# Patient Record
Sex: Female | Born: 1955 | Race: White | Hispanic: No | Marital: Married | Smoking: Never smoker
Health system: Southern US, Community
[De-identification: ages and names within clinical notes are randomized; demographics above are authoritative.]

## PROBLEM LIST (undated history)

## (undated) DIAGNOSIS — F32A Depression, unspecified: Secondary | ICD-10-CM

## (undated) DIAGNOSIS — F419 Anxiety disorder, unspecified: Secondary | ICD-10-CM

## (undated) DIAGNOSIS — T7840XA Allergy, unspecified, initial encounter: Secondary | ICD-10-CM

## (undated) DIAGNOSIS — M797 Fibromyalgia: Secondary | ICD-10-CM

## (undated) DIAGNOSIS — A879 Viral meningitis, unspecified: Secondary | ICD-10-CM

## (undated) DIAGNOSIS — G4733 Obstructive sleep apnea (adult) (pediatric): Secondary | ICD-10-CM

## (undated) DIAGNOSIS — J45909 Unspecified asthma, uncomplicated: Secondary | ICD-10-CM

## (undated) DIAGNOSIS — C50919 Malignant neoplasm of unspecified site of unspecified female breast: Secondary | ICD-10-CM

## (undated) DIAGNOSIS — I071 Rheumatic tricuspid insufficiency: Secondary | ICD-10-CM

## (undated) DIAGNOSIS — I10 Essential (primary) hypertension: Secondary | ICD-10-CM

## (undated) DIAGNOSIS — F329 Major depressive disorder, single episode, unspecified: Secondary | ICD-10-CM

## (undated) HISTORY — DX: Depression, unspecified: F32.A

## (undated) HISTORY — DX: Malignant neoplasm of unspecified site of unspecified female breast: C50.919

## (undated) HISTORY — DX: Major depressive disorder, single episode, unspecified: F32.9

## (undated) HISTORY — DX: Allergy, unspecified, initial encounter: T78.40XA

## (undated) HISTORY — DX: Obstructive sleep apnea (adult) (pediatric): G47.33

## (undated) HISTORY — PX: BREAST SURGERY: SHX581

## (undated) HISTORY — DX: Unspecified asthma, uncomplicated: J45.909

## (undated) HISTORY — DX: Viral meningitis, unspecified: A87.9

## (undated) HISTORY — DX: Anxiety disorder, unspecified: F41.9

## (undated) HISTORY — PX: MASTECTOMY: SHX3

## (undated) HISTORY — DX: Rheumatic tricuspid insufficiency: I07.1

## (undated) HISTORY — DX: Fibromyalgia: M79.7

## (undated) HISTORY — PX: VESICOVAGINAL FISTULA CLOSURE W/ TAH: SUR271

## (undated) HISTORY — DX: Essential (primary) hypertension: I10

---

## 1997-05-27 DIAGNOSIS — A879 Viral meningitis, unspecified: Secondary | ICD-10-CM

## 1997-05-27 HISTORY — DX: Viral meningitis, unspecified: A87.9

## 1998-03-28 ENCOUNTER — Emergency Department (HOSPITAL_COMMUNITY): Admission: EM | Admit: 1998-03-28 | Discharge: 1998-03-28 | Payer: Self-pay | Admitting: Emergency Medicine

## 1998-03-31 ENCOUNTER — Emergency Department (HOSPITAL_COMMUNITY): Admission: EM | Admit: 1998-03-31 | Discharge: 1998-03-31 | Payer: Self-pay | Admitting: *Deleted

## 1998-04-04 ENCOUNTER — Encounter (HOSPITAL_COMMUNITY): Admission: RE | Admit: 1998-04-04 | Discharge: 1998-04-25 | Payer: Self-pay | Admitting: *Deleted

## 1999-02-21 ENCOUNTER — Encounter: Payer: Self-pay | Admitting: Internal Medicine

## 1999-02-21 ENCOUNTER — Ambulatory Visit (HOSPITAL_COMMUNITY): Admission: RE | Admit: 1999-02-21 | Discharge: 1999-02-21 | Payer: Self-pay | Admitting: Internal Medicine

## 1999-04-19 ENCOUNTER — Emergency Department (HOSPITAL_COMMUNITY): Admission: EM | Admit: 1999-04-19 | Discharge: 1999-04-19 | Payer: Self-pay | Admitting: Emergency Medicine

## 1999-11-08 ENCOUNTER — Other Ambulatory Visit: Admission: RE | Admit: 1999-11-08 | Discharge: 1999-11-08 | Payer: Self-pay | Admitting: *Deleted

## 2000-06-27 DIAGNOSIS — I071 Rheumatic tricuspid insufficiency: Secondary | ICD-10-CM

## 2000-06-27 HISTORY — DX: Rheumatic tricuspid insufficiency: I07.1

## 2001-04-01 ENCOUNTER — Encounter: Payer: Self-pay | Admitting: General Surgery

## 2001-04-01 ENCOUNTER — Encounter: Admission: RE | Admit: 2001-04-01 | Discharge: 2001-04-01 | Payer: Self-pay | Admitting: General Surgery

## 2002-06-28 ENCOUNTER — Other Ambulatory Visit: Admission: RE | Admit: 2002-06-28 | Discharge: 2002-06-28 | Payer: Self-pay | Admitting: Obstetrics and Gynecology

## 2002-07-01 ENCOUNTER — Encounter: Payer: Self-pay | Admitting: Obstetrics and Gynecology

## 2002-07-01 ENCOUNTER — Encounter: Admission: RE | Admit: 2002-07-01 | Discharge: 2002-07-01 | Payer: Self-pay | Admitting: Obstetrics and Gynecology

## 2003-11-15 ENCOUNTER — Encounter: Admission: RE | Admit: 2003-11-15 | Discharge: 2003-11-15 | Payer: Self-pay | Admitting: Obstetrics and Gynecology

## 2004-01-28 DIAGNOSIS — I10 Essential (primary) hypertension: Secondary | ICD-10-CM

## 2004-01-28 HISTORY — DX: Essential (primary) hypertension: I10

## 2004-03-11 ENCOUNTER — Other Ambulatory Visit: Admission: RE | Admit: 2004-03-11 | Discharge: 2004-03-11 | Payer: Self-pay | Admitting: Obstetrics and Gynecology

## 2004-11-29 ENCOUNTER — Encounter: Admission: RE | Admit: 2004-11-29 | Discharge: 2004-11-29 | Payer: Self-pay | Admitting: Obstetrics and Gynecology

## 2005-02-27 DIAGNOSIS — M797 Fibromyalgia: Secondary | ICD-10-CM

## 2005-02-27 HISTORY — DX: Fibromyalgia: M79.7

## 2005-09-09 ENCOUNTER — Other Ambulatory Visit: Admission: RE | Admit: 2005-09-09 | Discharge: 2005-09-09 | Payer: Self-pay | Admitting: Obstetrics and Gynecology

## 2006-09-14 ENCOUNTER — Encounter: Admission: RE | Admit: 2006-09-14 | Discharge: 2006-09-14 | Payer: Self-pay | Admitting: Obstetrics and Gynecology

## 2006-09-16 ENCOUNTER — Encounter: Admission: RE | Admit: 2006-09-16 | Discharge: 2006-09-16 | Payer: Self-pay | Admitting: Obstetrics and Gynecology

## 2006-12-10 ENCOUNTER — Encounter: Admission: RE | Admit: 2006-12-10 | Discharge: 2006-12-10 | Payer: Self-pay | Admitting: Family Medicine

## 2008-11-01 ENCOUNTER — Encounter: Admission: RE | Admit: 2008-11-01 | Discharge: 2008-11-01 | Payer: Self-pay | Admitting: Obstetrics and Gynecology

## 2008-11-03 ENCOUNTER — Encounter (INDEPENDENT_AMBULATORY_CARE_PROVIDER_SITE_OTHER): Payer: Self-pay | Admitting: Obstetrics and Gynecology

## 2008-11-03 ENCOUNTER — Encounter (INDEPENDENT_AMBULATORY_CARE_PROVIDER_SITE_OTHER): Payer: Self-pay | Admitting: Diagnostic Radiology

## 2008-11-03 ENCOUNTER — Encounter: Admission: RE | Admit: 2008-11-03 | Discharge: 2008-11-03 | Payer: Self-pay | Admitting: Obstetrics and Gynecology

## 2008-11-09 ENCOUNTER — Encounter: Admission: RE | Admit: 2008-11-09 | Discharge: 2008-11-09 | Payer: Self-pay | Admitting: Obstetrics and Gynecology

## 2008-11-14 ENCOUNTER — Ambulatory Visit: Payer: Self-pay | Admitting: Genetic Counselor

## 2008-12-25 ENCOUNTER — Inpatient Hospital Stay (HOSPITAL_COMMUNITY): Admission: AD | Admit: 2008-12-25 | Discharge: 2008-12-27 | Payer: Self-pay | Admitting: Surgery

## 2008-12-25 ENCOUNTER — Encounter (INDEPENDENT_AMBULATORY_CARE_PROVIDER_SITE_OTHER): Payer: Self-pay | Admitting: Surgery

## 2009-01-15 ENCOUNTER — Ambulatory Visit: Payer: Self-pay | Admitting: Oncology

## 2009-01-15 LAB — CBC WITH DIFFERENTIAL/PLATELET
Eosinophils Absolute: 0.3 10*3/uL (ref 0.0–0.5)
HCT: 36.9 % (ref 34.8–46.6)
HGB: 12.4 g/dL (ref 11.6–15.9)
LYMPH%: 28.8 % (ref 14.0–49.7)
MONO#: 0.4 10*3/uL (ref 0.1–0.9)
NEUT#: 4.1 10*3/uL (ref 1.5–6.5)
NEUT%: 59.8 % (ref 38.4–76.8)
Platelets: 217 10*3/uL (ref 145–400)
WBC: 6.9 10*3/uL (ref 3.9–10.3)

## 2009-01-15 LAB — COMPREHENSIVE METABOLIC PANEL
CO2: 24 mEq/L (ref 19–32)
Calcium: 9.2 mg/dL (ref 8.4–10.5)
Creatinine, Ser: 0.86 mg/dL (ref 0.40–1.20)
Glucose, Bld: 81 mg/dL (ref 70–99)
Total Bilirubin: 0.3 mg/dL (ref 0.3–1.2)
Total Protein: 7.1 g/dL (ref 6.0–8.3)

## 2009-03-27 HISTORY — PX: ABDOMINAL HYSTERECTOMY: SHX81

## 2009-04-24 ENCOUNTER — Ambulatory Visit (HOSPITAL_COMMUNITY): Admission: RE | Admit: 2009-04-24 | Discharge: 2009-04-26 | Payer: Self-pay | Admitting: Obstetrics and Gynecology

## 2009-04-24 ENCOUNTER — Encounter (INDEPENDENT_AMBULATORY_CARE_PROVIDER_SITE_OTHER): Payer: Self-pay | Admitting: Obstetrics and Gynecology

## 2009-05-28 ENCOUNTER — Other Ambulatory Visit: Admission: RE | Admit: 2009-05-28 | Discharge: 2009-05-28 | Payer: Self-pay | Admitting: General Surgery

## 2009-06-11 ENCOUNTER — Ambulatory Visit (HOSPITAL_BASED_OUTPATIENT_CLINIC_OR_DEPARTMENT_OTHER): Admission: RE | Admit: 2009-06-11 | Discharge: 2009-06-11 | Payer: Self-pay | Admitting: Surgery

## 2010-01-11 ENCOUNTER — Ambulatory Visit: Payer: Self-pay | Admitting: Oncology

## 2010-02-11 ENCOUNTER — Ambulatory Visit: Payer: Self-pay | Admitting: Oncology

## 2010-04-22 LAB — BASIC METABOLIC PANEL
Calcium: 9.3 mg/dL (ref 8.4–10.5)
Chloride: 102 mEq/L (ref 96–112)
Creatinine, Ser: 0.88 mg/dL (ref 0.4–1.2)
GFR calc Af Amer: >60 mL/min (ref >60)
Potassium: 3.6 mEq/L (ref 3.5–5.1)
Sodium: 139 mEq/L (ref 135–145)

## 2010-04-22 LAB — URINALYSIS, ROUTINE W REFLEX MICROSCOPIC
Bilirubin Urine: NEGATIVE
Glucose, UA: NEGATIVE mg/dL
Hgb urine dipstick: NEGATIVE

## 2010-04-22 LAB — CBC
Hemoglobin: 10.5 g/dL — ABNORMAL LOW (ref 12.0–15.0)
MCV: 87.8 fL (ref 78.0–100.0)
Platelets: 211 10*3/uL (ref 150–400)
RBC: 3.56 MIL/uL — ABNORMAL LOW (ref 3.87–5.11)
RDW: 13.7 % (ref 11.5–15.5)
WBC: 8.1 10*3/uL (ref 4.0–10.5)

## 2010-04-29 ENCOUNTER — Ambulatory Visit (INDEPENDENT_AMBULATORY_CARE_PROVIDER_SITE_OTHER): Payer: BC Managed Care – PPO | Admitting: Family Medicine

## 2010-04-29 DIAGNOSIS — J309 Allergic rhinitis, unspecified: Secondary | ICD-10-CM

## 2010-04-29 DIAGNOSIS — F411 Generalized anxiety disorder: Secondary | ICD-10-CM

## 2010-04-29 DIAGNOSIS — G4700 Insomnia, unspecified: Secondary | ICD-10-CM

## 2010-05-01 LAB — COMPREHENSIVE METABOLIC PANEL
ALT: 25 U/L (ref 0–35)
Alkaline Phosphatase: 70 U/L (ref 39–117)
BUN: 18 mg/dL (ref 6–23)
CO2: 26 mEq/L (ref 19–32)
Calcium: 8.9 mg/dL (ref 8.4–10.5)
GFR calc non Af Amer: >60 mL/min (ref >60)
Total Bilirubin: 0.5 mg/dL (ref 0.3–1.2)

## 2010-05-01 LAB — CBC
MCV: 86.7 fL (ref 78.0–100.0)
RBC: 4.29 MIL/uL (ref 3.87–5.11)
RDW: 13.7 % (ref 11.5–15.5)

## 2010-05-01 LAB — DIFFERENTIAL
Basophils Relative: 1 % (ref 0–1)
Eosinophils Relative: 2 % (ref 0–5)
Lymphocytes Relative: 28 % (ref 12–46)
Monocytes Absolute: 0.4 10*3/uL (ref 0.1–1.0)

## 2010-05-01 LAB — URINALYSIS, ROUTINE W REFLEX MICROSCOPIC
Glucose, UA: NEGATIVE mg/dL
Hgb urine dipstick: NEGATIVE
Nitrite: NEGATIVE
Protein, ur: NEGATIVE mg/dL
Urobilinogen, UA: 0.2 mg/dL (ref 0.0–1.0)

## 2010-06-20 ENCOUNTER — Other Ambulatory Visit: Payer: Self-pay | Admitting: Family Medicine

## 2010-06-20 NOTE — Telephone Encounter (Signed)
Forward to Dr Knapp 

## 2010-07-18 ENCOUNTER — Encounter: Payer: Self-pay | Admitting: Family Medicine

## 2010-07-18 ENCOUNTER — Ambulatory Visit (INDEPENDENT_AMBULATORY_CARE_PROVIDER_SITE_OTHER): Payer: PRIVATE HEALTH INSURANCE | Admitting: Family Medicine

## 2010-07-18 VITALS — BP 132/88 | HR 64 | Ht 64.0 in | Wt 198.0 lb

## 2010-07-18 DIAGNOSIS — R5383 Other fatigue: Secondary | ICD-10-CM

## 2010-07-18 DIAGNOSIS — L03211 Cellulitis of face: Secondary | ICD-10-CM

## 2010-07-18 DIAGNOSIS — L0201 Cutaneous abscess of face: Secondary | ICD-10-CM

## 2010-07-18 DIAGNOSIS — R5381 Other malaise: Secondary | ICD-10-CM

## 2010-07-18 LAB — CBC WITH DIFFERENTIAL/PLATELET
Basophils Absolute: 0 10*3/uL (ref 0.0–0.1)
Basophils Relative: 0 % (ref 0–1)
Hemoglobin: 12.6 g/dL (ref 12.0–15.0)
MCH: 27.9 pg (ref 26.0–34.0)
RDW: 15 % (ref 11.5–15.5)
WBC: 5.3 10*3/uL (ref 4.0–10.5)

## 2010-07-18 LAB — COMPREHENSIVE METABOLIC PANEL
Albumin: 4 g/dL (ref 3.5–5.2)
Alkaline Phosphatase: 71 U/L (ref 39–117)
BUN: 13 mg/dL (ref 6–23)
Chloride: 104 mEq/L (ref 96–112)
Creat: 0.91 mg/dL (ref 0.50–1.10)
Glucose, Bld: 84 mg/dL (ref 70–99)
Total Protein: 7.1 g/dL (ref 6.0–8.3)

## 2010-07-18 MED ORDER — SULFAMETHOXAZOLE-TMP DS 800-160 MG PO TABS: 1.0000 | ORAL_TABLET | Freq: Two times a day (BID) | ORAL | Status: AC

## 2010-07-18 NOTE — Progress Notes (Signed)
Subjective:    Patient ID: Allison Valencia, female    DOB: 1955/11/14, 55 y.o.   MRN: [id number]  HPI Rash to left side of face x 1 month.  Initially thought could be related to her cell phone use on that side of her face.  Washes with antibacterial soap twice daily.  She admits to squeezing the lesions.  Gets some bloody drainage, no pus.  They don't seem to be healing.. Denies any itching, or other lesions on other parts of the body (other than face, neck and 1 lesions on scalp that has healed).  Complaining of fatigue, lack of motivation to do anything.  She just had her last breast surgery 3 weeks ago. Wondering if she still could be having effects of anesthesia.  Moods have been good.  Sleeps well at night--uses xanax to help shut her mind down.  Hasn't been walking or exercising since the surgery.  Past Medical History  Diagnosis Date  . Breast cancer age 77, recurrent 2010  . Allergy   . Anxiety   . Depression   . OSA (obstructive sleep apnea)     sleep study 6/09  . Fibromyalgia 2/07  . Hypertension 2006  . Viral meningitis 5/99  . Tricuspid regurgitation 6/02    mild    Past Surgical History  Procedure Date  . Breast surgery     extended partial R mastectomy with LND age 55; bilateral mastectomy 11/2008, multiple reconstructive surgeries 2011-2012  . Abdominal hysterectomy 03/2009    and BSO; Dr. Stefano Gaul    History   Social History  . Marital Status: Married    Spouse Name: N/A    Number of Children: N/A  . Years of Education: N/A   Occupational History  . Not on file.   Social History Main Topics  . Smoking status: Never Smoker   . Smokeless tobacco: Not on file  . Alcohol Use: Yes     1-2 drinks once or twice a month.  . Drug Use: No  . Sexually Active: Not on file   Other Topics Concern  . Not on file   Social History Narrative  . No narrative on file    Family History  Problem Relation Age of Onset  . Asthma Mother   . Hypertension Mother     . Thyroid disease Mother   . Arthritis Mother   . Heart disease Father     MI  . Diabetes Father   . Bipolar disorder Daughter   . Cancer Maternal Aunt     colon  . Cancer Paternal Aunt     breast cancer  . Cancer Maternal Aunt     colon    Current outpatient prescriptions:ALPRAZolam (XANAX) 1 MG tablet, TAKE ONE-HALF TO ONE TABLET BY MOUTH EVERY 8 HOURS AS NEEDED FOR ANXIETY, Disp: 90 tablet, Rfl: 0;  citalopram (CELEXA) 40 MG tablet, Take 40 mg by mouth daily.  , Disp: , Rfl: ;  sulfamethoxazole-trimethoprim (BACTRIM DS) 800-160 MG per tablet, Take 1 tablet by mouth 2 (two) times daily., Disp: 20 tablet, Rfl: 0  No Known Allergies  Review of Systems No fevers, nausea or vomiting, occasional heartburn, worse recently since eating more pineapple.  Denies allergies or URI symptoms, cough, shortness of breath, or other concerns     Objective:   Physical Exam BP 132/88  Pulse 64  Ht 5\' 4"  (1.626 m)  Wt 198 lb (89.812 kg)  BMI 33.99 kg/m2 Well developed, pleasant, overweight, tanned female in  no distress Skin: L cheek, and anterior to L ear are multiple scabbed, inflamed lesions.  There is some crusting.  Also has some lesions L posterior neck Neck:  No significant lymphadenopathy.  No thyromegaly Heart:  Regular rate and rhythm Lungs:  Clear bilaterally       Assessment & Plan:   1. Abscess of face  sulfamethoxazole-trimethoprim (BACTRIM DS) 800-160 MG per tablet  2. Fatigue  Comprehensive metabolic panel, CBC with Differential, Vitamin D 25 hydroxy, TSH    Lesions appear consistent with probable cysts that are infected, possibly MRSA.  Daughter gets frequent MRSA, so will cover for it with antibiotic choice. Fatigue--if all labs are normal, encouraged her to restart regular exercise routine, which might help with her energy.  May also be related to her OSA

## 2010-07-19 ENCOUNTER — Encounter: Payer: Self-pay | Admitting: Family Medicine

## 2010-08-26 ENCOUNTER — Other Ambulatory Visit: Payer: Self-pay | Admitting: Family Medicine

## 2010-08-26 NOTE — Telephone Encounter (Signed)
Refill request for xanax. Chart is on your shelf. Thanks.

## 2010-08-26 NOTE — Telephone Encounter (Signed)
Okay for refill--please call in

## 2010-09-18 ENCOUNTER — Ambulatory Visit (INDEPENDENT_AMBULATORY_CARE_PROVIDER_SITE_OTHER): Payer: PRIVATE HEALTH INSURANCE | Admitting: Family Medicine

## 2010-09-18 ENCOUNTER — Encounter: Payer: Self-pay | Admitting: Family Medicine

## 2010-09-18 VITALS — BP 140/70 | HR 68 | Temp 98.3°F | Ht 64.0 in | Wt 198.0 lb

## 2010-09-18 DIAGNOSIS — J069 Acute upper respiratory infection, unspecified: Secondary | ICD-10-CM

## 2010-09-18 NOTE — Progress Notes (Signed)
Began 5 days ago with "cold symptoms"--runny nose, sinus drainage, postnasal drip, sore throat, itchy ears. Then coughing started.  Denies fevers. Denies sinus pain, purulent drainage.  Cough is nonproductive.  Had fits of coughing yesterday, to the point it was causing back pain.  Sleeping more than usual, fatigued.  Denies myalgias.  No known sick contacts (other than having cranky grandchild).  Using Dayquil and Nyquil with some improvement.  Has surgery scheduled for 9/4 (reconstructive surgery), and wants to make sure she is well enough for surgery.  Past Medical History  Diagnosis Date  . Breast cancer age 75, recurrent 2010  . Allergy   . Anxiety   . Depression   . OSA (obstructive sleep apnea)     sleep study 6/09  . Fibromyalgia 2/07  . Hypertension 2006  . Viral meningitis 5/99  . Tricuspid regurgitation 6/02    mild    Past Surgical History  Procedure Date  . Breast surgery     extended partial R mastectomy with LND age 62; bilateral mastectomy 11/2008, multiple reconstructive surgeries 2011-2012  . Abdominal hysterectomy 03/2009    and BSO; Dr. Stefano Gaul    History   Social History  . Marital Status: Married    Spouse Name: N/A    Number of Children: N/A  . Years of Education: N/A   Occupational History  . Not on file.   Social History Main Topics  . Smoking status: Never Smoker   . Smokeless tobacco: Not on file  . Alcohol Use: Yes     1-2 drinks once or twice a month.  . Drug Use: No  . Sexually Active: Not on file   Other Topics Concern  . Not on file   Social History Narrative  . No narrative on file    Family History  Problem Relation Age of Onset  . Asthma Mother   . Hypertension Mother   . Thyroid disease Mother   . Arthritis Mother   . Heart disease Father     MI  . Diabetes Father   . Bipolar disorder Daughter   . Cancer Maternal Aunt     colon  . Cancer Paternal Aunt     breast cancer  . Cancer Maternal Aunt     colon     Current outpatient prescriptions:ALPRAZolam (XANAX) 1 MG tablet, TAKE ONE-HALF TO ONE TABLET BY MOUTH EVERY 8 HOURS AS NEEDED FOR ANXIETY, Disp: 90 tablet, Rfl: 0;  citalopram (CELEXA) 40 MG tablet, Take 40 mg by mouth daily.  , Disp: , Rfl:   No Known Allergies  ROS:  See HPI.  Denies skin rashes, joint rashes, nausea, vomiting, diarrhea (although slightly loose bowels)  PHYSICAL EXAM: BP 140/70  Pulse 68  Temp(Src) 98.3 F (36.8 C) (Oral)  Ht 5\' 4"  (1.626 m)  Wt 198 lb (89.812 kg)  BMI 33.99 kg/m2 Well developed female, in no distress.  Occasional bout of dry cough.  Easily speaks in full sentences; coughing with laughter HEENT: nasal mucosa mildly edematous, no erythema or purulence.  Sinuses nontender.  OP normal.  TM's and EAC's normal Neck: no lymphadenopathy or thyromegaly Heart: regular rate and rhythm Lungs: some coarse breath sounds, better after cough. No wheezes, even with forced expiration Back : no spinal tenderness.  + trigger points (known fibromyalgia), no muscle spasm Extremities: no edema Skin: no rash Psych: normal mood, affect, hygiene and grooming  ASSESSMENT/PLAN: 1. URI (upper respiratory infection)    No evidence of bacterial infection Use  OTC cold medications as directed (needs to take more frequently) Check ingredients--if it doesn't contain Guaifenesin, then add plain Mucinex twice daily Call next week if worsening symptoms.  Follow up immediately if high fevers, worsening shortness of breath, cough, coughing up blood, worsening purulent drainage (discolored mucus or phlegm)

## 2010-09-18 NOTE — Patient Instructions (Signed)
URI (virus) No evidence of bacterial infection Use OTC cold medications as directed (needs to take more frequently) Check ingredients--if it doesn't contain Guaifenesin, then add plain Mucinex twice daily Call next week if worsening symptoms.  Follow up immediately if high fevers, worsening shortness of breath, cough, coughing up blood, worsening purulent drainage (discolored mucus or phlegm)

## 2010-10-24 ENCOUNTER — Other Ambulatory Visit: Payer: Self-pay | Admitting: Family Medicine

## 2010-10-24 NOTE — Telephone Encounter (Signed)
Ok to refill #90.  Please phone in and enter in computer.  Advise pt that refill was sent to pharmacy, and ask if she got the shirt I left at her house.  Thanks

## 2010-10-24 NOTE — Telephone Encounter (Signed)
Called patient to notify her that her rx was called into pharmacy.

## 2010-10-24 NOTE — Telephone Encounter (Signed)
This was in the refill requests. Thanks.

## 2010-11-29 ENCOUNTER — Other Ambulatory Visit: Payer: Self-pay | Admitting: Family Medicine

## 2010-12-02 ENCOUNTER — Other Ambulatory Visit: Payer: Self-pay | Admitting: Family Medicine

## 2010-12-03 ENCOUNTER — Other Ambulatory Visit: Payer: Self-pay | Admitting: Family Medicine

## 2010-12-03 NOTE — Telephone Encounter (Signed)
Is this okay to refill? 

## 2010-12-03 NOTE — Telephone Encounter (Signed)
Dr. Lynelle Doctor Is it okay to refill?

## 2010-12-03 NOTE — Telephone Encounter (Signed)
mmmmm

## 2010-12-03 NOTE — Telephone Encounter (Signed)
No, Suzette Battiest already took care of this on 11/2.  Would you mind just checking with pharmacy to ensure it was received if they are still sending requests?  Per Medications section, it looks like 90 was called in 11/2 to [facility]. Thanks

## 2010-12-03 NOTE — Telephone Encounter (Signed)
It is okay to refill 

## 2010-12-03 NOTE — Telephone Encounter (Signed)
Dr. Knapp 

## 2011-01-14 ENCOUNTER — Other Ambulatory Visit: Payer: Self-pay | Admitting: Family Medicine

## 2011-01-15 NOTE — Telephone Encounter (Signed)
Is this ok to call in?

## 2011-01-15 NOTE — Telephone Encounter (Signed)
Okay to phone in.

## 2011-01-17 ENCOUNTER — Emergency Department (INDEPENDENT_AMBULATORY_CARE_PROVIDER_SITE_OTHER)
Admission: EM | Admit: 2011-01-17 | Discharge: 2011-01-17 | Disposition: A | Discharge status: Home / Self Care | Payer: PRIVATE HEALTH INSURANCE | Source: Home / Self Care

## 2011-01-17 ENCOUNTER — Encounter (HOSPITAL_COMMUNITY): Payer: Self-pay | Admitting: *Deleted

## 2011-01-17 ENCOUNTER — Telehealth: Payer: Self-pay | Admitting: Internal Medicine

## 2011-01-17 DIAGNOSIS — R338 Other retention of urine: Secondary | ICD-10-CM

## 2011-01-17 NOTE — Telephone Encounter (Signed)
LMOVM--?if ongoing symptoms.  DDx spasm after catheter for surgery, vs UTI. ?if able to give urine specimen for u/a.  She can either call me back or call office if this hasn't already been addressed by another physician (ie not sure if she called her surgeon or anyone else).  (surgery was further breast reconstruction)

## 2011-01-17 NOTE — ED Provider Notes (Signed)
History     CSN: [id number]  Arrival date & time 01/17/11  1330   None     Chief Complaint  Patient presents with  . Urinary Retention    (Consider location/radiation/quality/duration/timing/severity/associated sxs/prior treatment) HPI Comments: Pt presents with c/o urinary retention. She had breast reconstruction surgery with abd liposuction yesterday. Hx of Breast CA. She states she has been able to void only dribbles and has had the same problem after all of her other surgeries, and that once she is cathed one time she can then begin urinating on her own. No fever or chills.    Past Medical History  Diagnosis Date  . Breast cancer age 55, recurrent 2010  . Allergy   . Anxiety   . Depression   . OSA (obstructive sleep apnea)     sleep study 6/09  . Fibromyalgia 2/07  . Hypertension 2006  . Viral meningitis 5/99  . Tricuspid regurgitation 6/02    mild  . Breast cancer     Past Surgical History  Procedure Date  . Breast surgery     extended partial R mastectomy with LND age 49; bilateral mastectomy 11/2008, multiple reconstructive surgeries 2011-2012  . Abdominal hysterectomy 03/2009    and BSO; Dr. Stefano Gaul  . Mastectomy   . Vesicovaginal fistula closure w/ tah     Family History  Problem Relation Age of Onset  . Asthma Mother   . Hypertension Mother   . Thyroid disease Mother   . Arthritis Mother   . Heart disease Father     MI  . Diabetes Father   . Bipolar disorder Daughter   . Cancer Maternal Aunt     colon  . Cancer Paternal Aunt     breast cancer  . Cancer Maternal Aunt     colon    History  Substance Use Topics  . Smoking status: Never Smoker   . Smokeless tobacco: Not on file  . Alcohol Use: Yes     1-2 drinks once or twice a month.    OB History    Grav Para Term Preterm Abortions TAB SAB Ect Mult Living                  Review of Systems  Constitutional: Negative for fever and chills.  Gastrointestinal: Negative for nausea,  vomiting and abdominal pain.  Genitourinary: Positive for decreased urine volume. Negative for dysuria, urgency and frequency.    Allergies  Review of patient's allergies indicates no known allergies.  Home Medications   Current Outpatient Rx  Name Route Sig Dispense Refill  . HYDROCODONE-ACETAMINOPHEN 5-325 MG PO TABS Oral Take 2 tablets by mouth every 4 (four) hours as needed.      . ALPRAZOLAM 1 MG PO TABS  TAKE ONE-HALF TO ONE TABLET BY MOUTH EVERY 8 HOURS AS NEEDED FOR ANXIETY 90 tablet 0  . CITALOPRAM HYDROBROMIDE 40 MG PO TABS Oral Take 40 mg by mouth daily.        BP 138/65  Pulse 75  Temp 99.5 F (37.5 C)  Resp 18  SpO2 97%  Physical Exam  Nursing note and vitals reviewed. Constitutional: She appears well-developed and well-nourished. No distress.  HENT:  Head: Normocephalic and atraumatic.  Cardiovascular: Normal rate, regular rhythm and normal heart sounds.   Pulmonary/Chest: Effort normal and breath sounds normal.  Abdominal: Soft. Bowel sounds are normal. She exhibits no distension. There is no hepatosplenomegaly. There is generalized tenderness. There is no CVA tenderness.  Neurological:  She is alert.  Skin: Skin is warm and dry.  Psychiatric: She has a normal mood and affect.    ED Course  Procedures (including critical care time)  Labs Reviewed - No data to display No results found.   1. Acute urinary retention       MDM  Post-op urinary retention.         Melody Comas, [place] 01/17/11 [phone]

## 2011-01-17 NOTE — ED Notes (Signed)
Pt is here with complaints of inability to void s/p bilat breast reconstruction at [facility] yesterday am.  Pt denies pain or bladder distention.  Reports history of inability to void after a surgery.

## 2011-01-20 NOTE — Telephone Encounter (Signed)
It appears she was seen in ER on 12/21 (prior to my return call) with urinary retention

## 2011-01-30 NOTE — ED Provider Notes (Signed)
Medical screening examination/treatment/procedure(s) were performed by non-physician practitioner and as supervising physician I was immediately available for consultation/collaboration.  LYKINS,KIMBERLY G  D.O.    Randa Spike, MD 01/30/11 [phone]

## 2011-03-05 ENCOUNTER — Telehealth: Payer: Self-pay | Admitting: Family Medicine

## 2011-03-05 DIAGNOSIS — F419 Anxiety disorder, unspecified: Secondary | ICD-10-CM

## 2011-03-05 MED ORDER — ALPRAZOLAM 1 MG PO TABS: 1.0000 mg | ORAL_TABLET | Freq: Two times a day (BID) | ORAL | Status: DC

## 2011-03-05 NOTE — Telephone Encounter (Signed)
Spoke with pt and let her know that Dr.Knapp ok'd #45 Xanax and I called into [facility]. She needs to schedule a follow up on anxiety, she was not near a calendar and stated that she would call back to schedule ASAP.

## 2011-03-05 NOTE — Telephone Encounter (Signed)
Looks like was last refilled 12/18 for #90.  She was last seen 8/22.  Okay for #45, and schedule OV to discuss/follow-up on anxiety.  Thanks

## 2011-04-30 ENCOUNTER — Encounter: Payer: Self-pay | Admitting: Family Medicine

## 2011-04-30 ENCOUNTER — Ambulatory Visit (INDEPENDENT_AMBULATORY_CARE_PROVIDER_SITE_OTHER): Payer: PRIVATE HEALTH INSURANCE | Admitting: Family Medicine

## 2011-04-30 VITALS — BP 122/78 | HR 80 | Temp 97.9°F | Ht 64.0 in | Wt 208.0 lb

## 2011-04-30 DIAGNOSIS — G4700 Insomnia, unspecified: Secondary | ICD-10-CM

## 2011-04-30 DIAGNOSIS — F419 Anxiety disorder, unspecified: Secondary | ICD-10-CM

## 2011-04-30 DIAGNOSIS — F411 Generalized anxiety disorder: Secondary | ICD-10-CM

## 2011-04-30 DIAGNOSIS — J329 Chronic sinusitis, unspecified: Secondary | ICD-10-CM

## 2011-04-30 MED ORDER — ALPRAZOLAM 1 MG PO TABS: 1.0000 mg | ORAL_TABLET | Freq: Three times a day (TID) | ORAL | Status: DC | PRN

## 2011-04-30 MED ORDER — AZITHROMYCIN 250 MG PO TABS: ORAL_TABLET | ORAL | Status: AC

## 2011-04-30 NOTE — Progress Notes (Signed)
Patient presents to follow up on anxiety.  She states it is a lot better than it used to be, but still there.  She can't turn her brain off at night, and uses 1 xanax every night at bedtime to help get her to sleep.  Sometimes during the day, with certain movements, she feels her implant shift, and she gets incredibly squeamish and anxious, thinking about a foreign body inside of her.  Occurs a few times a week, more when grandson is visiting.  She also occasionally has restless legs in the evenings (earlier in the evening), which isn't treated.  Has had problems with sleeping ever since her hysterectomy.  Got nightmares with Simply Sleep and her husband's Lunesta. Xanax works well for her.  She meditates every morning with her husband.  Last surgery was in December, and hasn't gotten back to regular exercise yet.  Head congestion and post nasal drip x 5 days.  Exposed to sick grandson.  Denies fevers.  Mucus is gold and green, getting darker.  Feeling woozy, dizzy/off balance today.  Having some shortness of breath with exertion.  She has a h/o asthma, and this feels different. Using Nyquil and Dayquil with some improvement.  Yesterday had some myalgias, back pain, better today.   Past Medical History  Diagnosis Date  . Breast cancer age 74, recurrent 2010  . Allergy   . Anxiety   . Depression   . OSA (obstructive sleep apnea)     sleep study 6/09  . Fibromyalgia 2/07  . Hypertension 2006  . Viral meningitis 5/99  . Tricuspid regurgitation 6/02    mild  . Breast cancer     Past Surgical History  Procedure Date  . Breast surgery     extended partial R mastectomy with LND age 46; bilateral mastectomy 11/2008, multiple reconstructive surgeries 2011-2012  . Abdominal hysterectomy 03/2009    and BSO; Dr. Stefano Gaul  . Mastectomy   . Vesicovaginal fistula closure w/ tah     History   Social History  . Marital Status: Married    Spouse Name: N/A    Number of Children: N/A  . Years of  Education: N/A   Occupational History  . Not on file.   Social History Main Topics  . Smoking status: Never Smoker   . Smokeless tobacco: Not on file  . Alcohol Use: Yes     1-2 drinks once or twice a month.  . Drug Use: No  . Sexually Active: Not on file   Other Topics Concern  . Not on file   Social History Narrative  . No narrative on file    Family History  Problem Relation Age of Onset  . Asthma Mother   . Hypertension Mother   . Thyroid disease Mother   . Arthritis Mother   . Heart disease Father     MI  . Diabetes Father   . Bipolar disorder Daughter   . Cancer Maternal Aunt     colon  . Cancer Paternal Aunt     breast cancer  . Cancer Maternal Aunt     colon   Current Outpatient Prescriptions on File Prior to Visit  Medication Sig Dispense Refill  . citalopram (CELEXA) 40 MG tablet Take 40 mg by mouth daily.         No Known Allergies  ROS:  Denies fevers, nausea, vomiting, diarrhea, urinary complaints, skin rash.  See HPI for other complaints.  PHYSICAL EXAM: BP 122/78  Pulse 80  Temp(Src) 97.9 F (36.6 C) (Oral)  Ht 5\' 4"  (1.626 m)  Wt 208 lb (94.348 kg)  BMI 35.70 kg/m2 Well developed, very congested, sniffling female, in no distress HEENT: PERRL, EOMI, conjunctiva clear, TM's and EAC's normal.  Nasal mucosa moderately edematous, +erythema, clear mucus.  Tender over frontal sinuses bilaterally Neck: no lymphadenopathy or thyromegaly Heart: regular rate and rhythm without murmur Lungs: clear bilaterally with good air movement Psych: normal mood, affect, hygiene and grooming Skin: no rash  ASSESSMENT/PLAN:  1. Anxiety  ALPRAZolam (XANAX) 1 MG tablet  2. Sinusitis  azithromycin (ZITHROMAX) 250 MG tablet  3. Anxiety state, unspecified    4. Insomnia     Sinusitis--continue decongestants.  Trial of sinus rinses (sinus rinse kit or neti-pot).  Add Guaifenesin (ie Mucinex). z-pak--can wait a day or so and if symptoms not improving, then take  entire pack.  If symptoms resolving on their own over the next few days, then hold off on antibiotics.  There might possibly be a component of seasonal allergies.  Taking Claritin or Zyrtec will help--do this instead of using Nyquil during the day, and it should help dry up some of the secretions, along with continuing to use Dayquil.  Anxiety--continue current medications.  Okay to use xanax at bedtime--not my favorite choice.  Consider trying meditation in the evenings, and perhaps decrease xanax dose or just use infrequently as needed.  Recommended support groups/counseling regarding anxiety about implants.  Asking for 90 day supply of xanax per her insurance.

## 2011-04-30 NOTE — Patient Instructions (Signed)
Sinusitis--continue decongestants.  Trial of sinus rinses (sinus rinse kit or neti-pot).  Add Guaifenesin (ie Mucinex). z-pak--can wait a day or so and if symptoms not improving, then take entire pack.  If symptoms resolving on their own over the next few days, then hold off on antibiotics.  There might possibly be a component of seasonal allergies.  Taking Claritin or Zyrtec will help--do this instead of using Nyquil during the day, and it should help dry up some of the secretions, along with continuing to use Dayquil.  Anxiety--continue current medications.  Okay to use xanax at bedtime--not Dr. Delford Field favorite choice.  Consider trying meditation in the evenings, and perhaps decrease xanax dose or just use infrequently as needed

## 2011-05-19 IMAGING — CR DG CHEST 2V
2 series · 2 of 2 positions shown · non-contrast
Comparison: None

CLINICAL DATA: Right breast carcinoma.  Asthma and hypertension.
Preop respiratory exam.

CHEST - 2 VIEW

[view not recorded (1 of 2)]
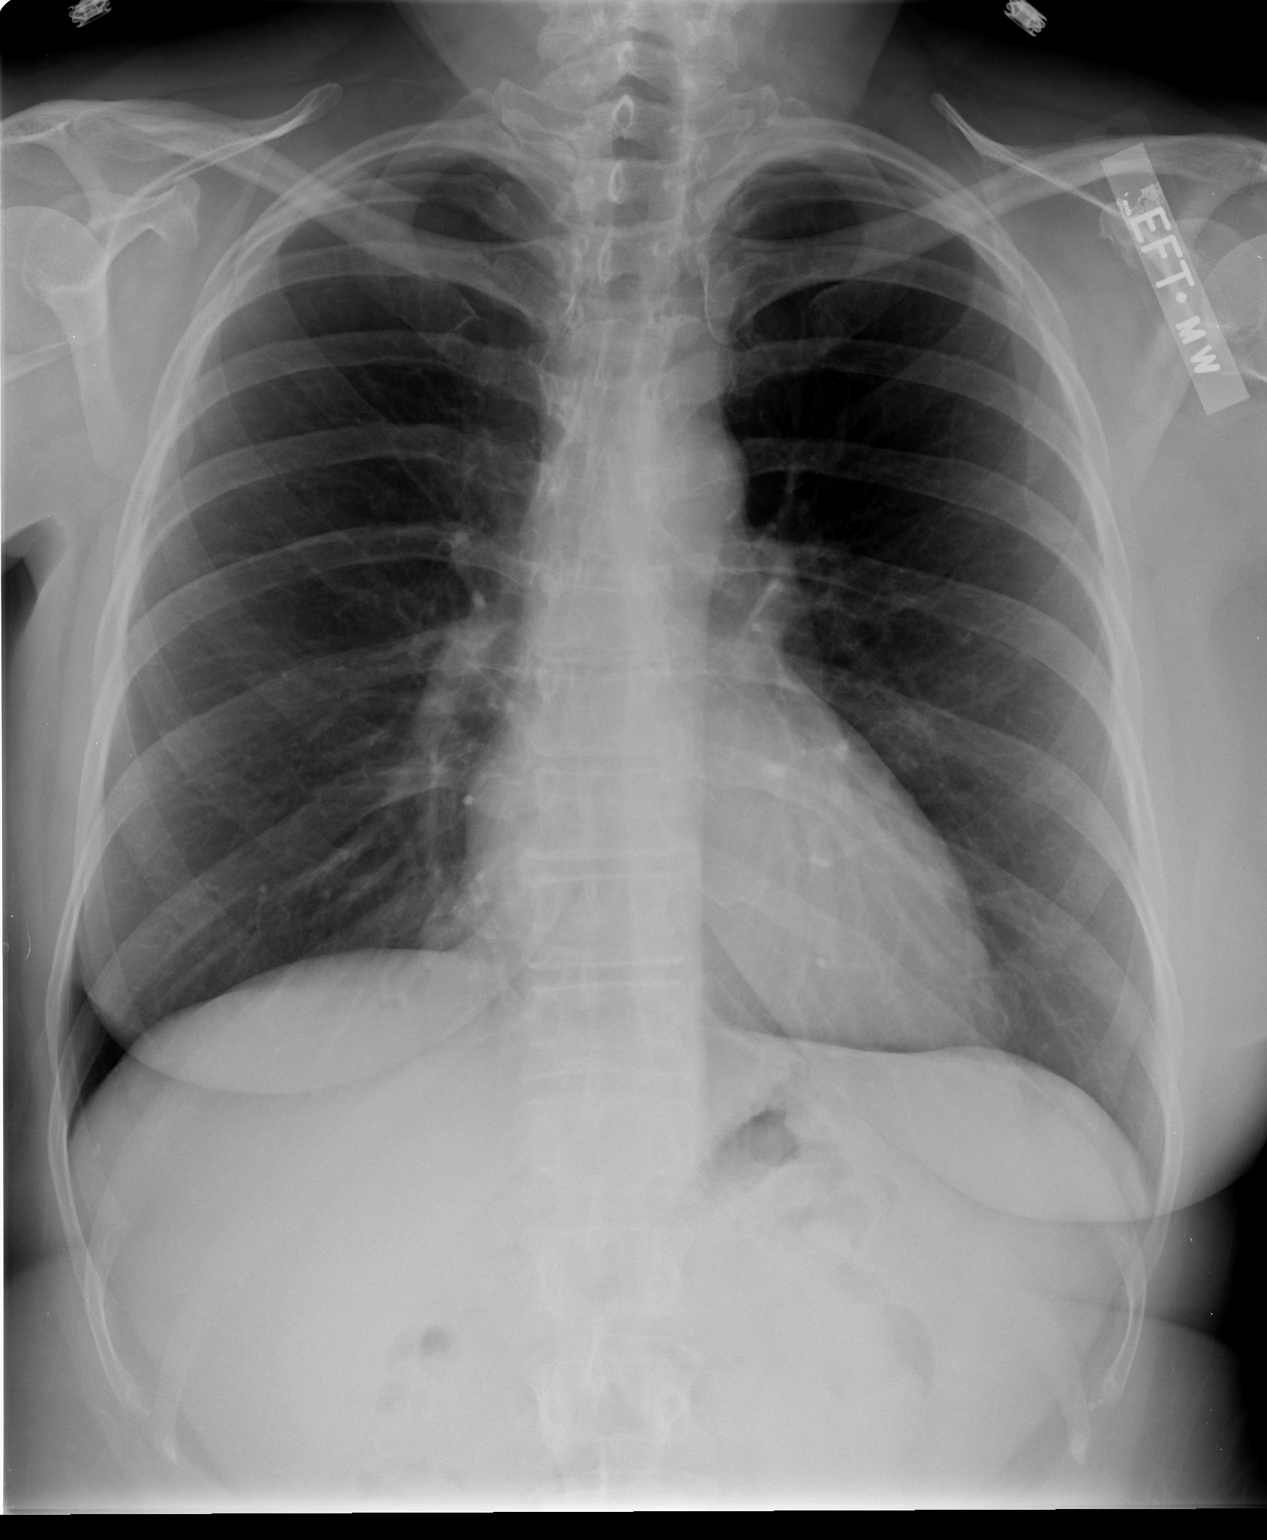

[view not recorded (2 of 2)]
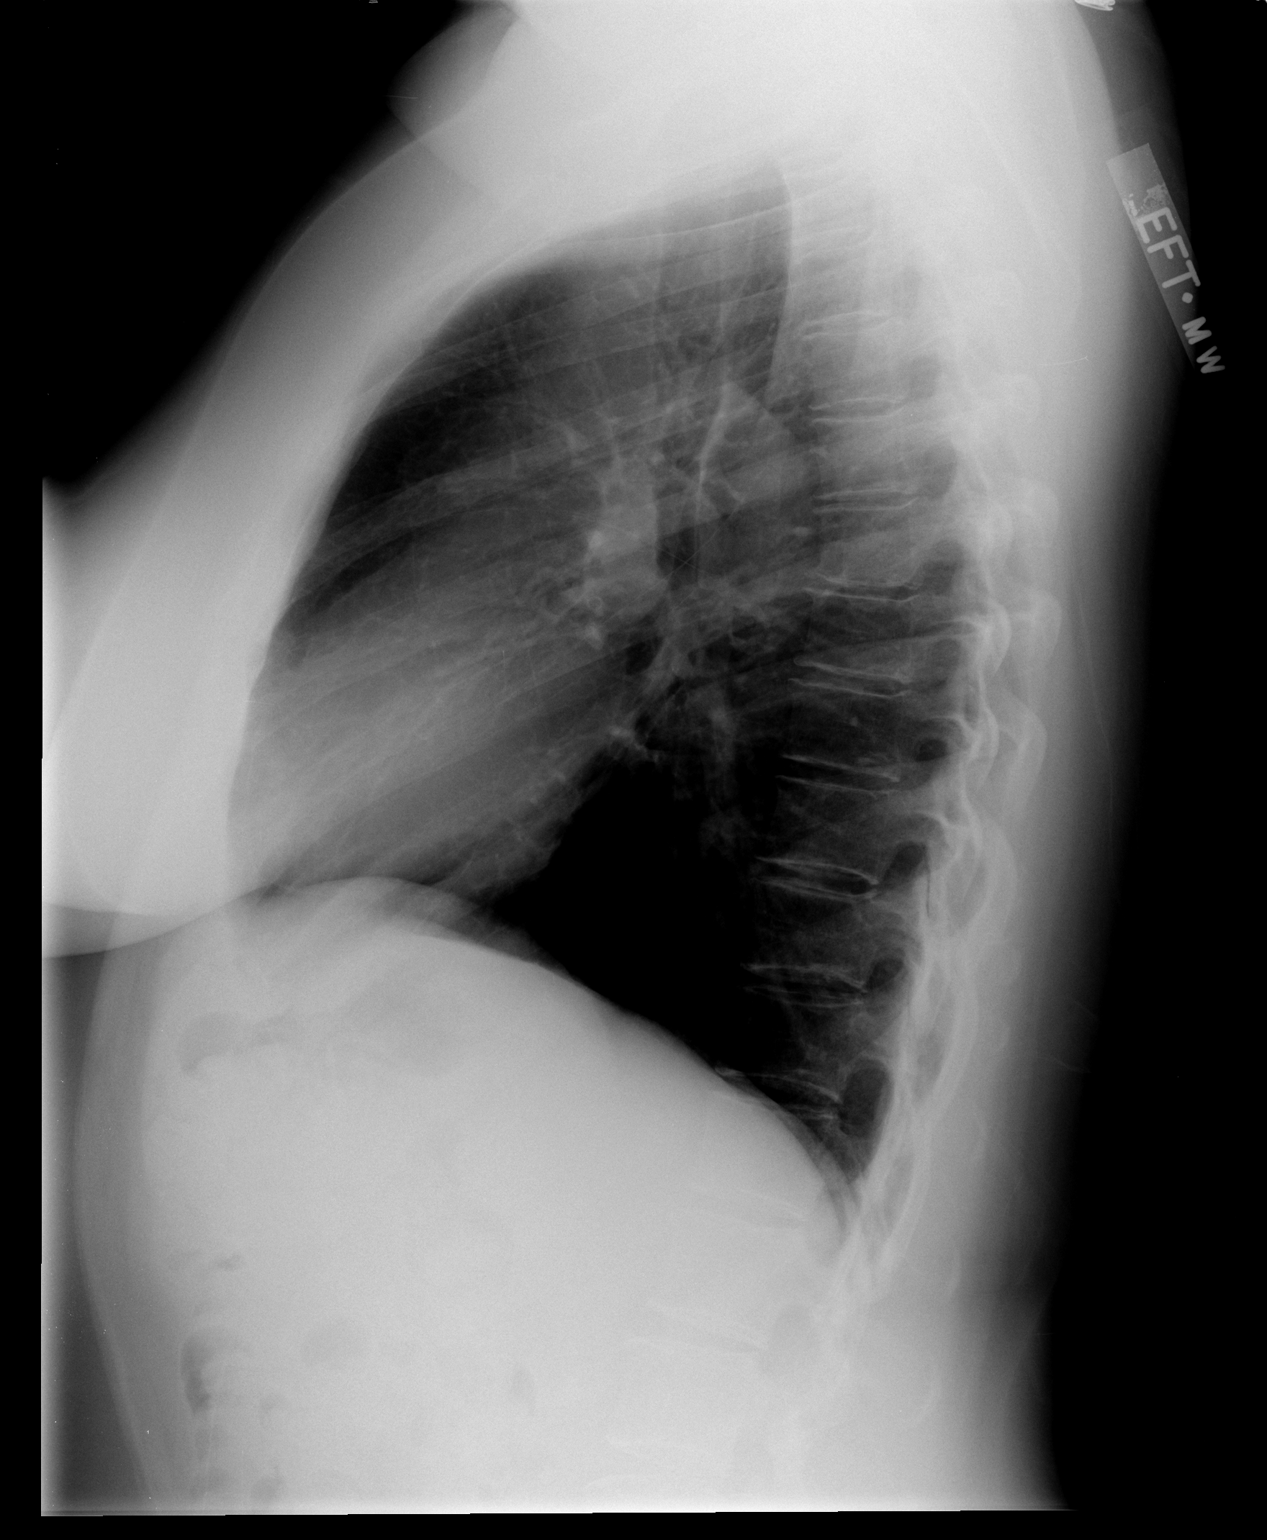

[2 of 2 positions shown; findings below may reference images not displayed]

FINDINGS: The heart size and mediastinal contours are within
normal limits.  Both lungs are clear.  The visualized skeletal
structures are unremarkable.
IMPRESSION: No active cardiopulmonary disease.

## 2011-05-20 ENCOUNTER — Other Ambulatory Visit: Payer: Self-pay | Admitting: Family Medicine

## 2011-05-20 DIAGNOSIS — F411 Generalized anxiety disorder: Secondary | ICD-10-CM

## 2011-05-20 NOTE — Telephone Encounter (Signed)
RX REFILL ON CELEXA.

## 2011-05-20 NOTE — Telephone Encounter (Signed)
done

## 2011-06-26 ENCOUNTER — Encounter: Payer: Self-pay | Admitting: Family Medicine

## 2011-06-26 ENCOUNTER — Ambulatory Visit (INDEPENDENT_AMBULATORY_CARE_PROVIDER_SITE_OTHER): Payer: PRIVATE HEALTH INSURANCE | Admitting: Family Medicine

## 2011-06-26 VITALS — BP 138/84 | HR 68 | Ht 64.0 in | Wt 210.0 lb

## 2011-06-26 DIAGNOSIS — IMO0001 Reserved for inherently not codable concepts without codable children: Secondary | ICD-10-CM

## 2011-06-26 DIAGNOSIS — M549 Dorsalgia, unspecified: Secondary | ICD-10-CM

## 2011-06-26 DIAGNOSIS — M797 Fibromyalgia: Secondary | ICD-10-CM | POA: Insufficient documentation

## 2011-06-26 MED ORDER — PREGABALIN 75 MG PO CAPS: ORAL_CAPSULE | ORAL | Status: DC

## 2011-06-26 MED ORDER — MELOXICAM 15 MG PO TABS: 15.0000 mg | ORAL_TABLET | Freq: Every day | ORAL | Status: DC

## 2011-06-26 MED ORDER — HYDROCODONE-ACETAMINOPHEN 5-500 MG PO TABS: 1.0000 | ORAL_TABLET | Freq: Four times a day (QID) | ORAL | Status: AC | PRN

## 2011-06-26 NOTE — Progress Notes (Signed)
Chief Complaint  Patient presents with  . Pain    all over body pain for at least over a month. Her entire body is hurting her, needing assistance from her husband to do ADL's. She even hurts when she is just sitting. Also need medical clearance form for [employer] filled out (on paper chart).   HPI:  1 month h/o muscle and joint pains, mostly in shoulders, knees, glutes, neck and back, and in her hips, especially when sitting.  Waking her up from sleep multiple times at night.  She "feels like an old lady", needing husband's assistance with ADL's.  Denies any changes in activity, medications, or other known cause of pain.    She feels this is different from previous pain associated with diagnosis of fibromyalgia in the past, as pain was never this bad or widespread--previously just limited to an area in her low back. Pain is constant, worse with activity.  5/10 pain.  Unable to exercise due to pain.  Has been sedentary, had 10 surgeries in 2 years, last in 12/2010 (breast reconstruction after recurrent cancer).  She is interested in a [facility] program that is starting at the [facility] and needs paperwork signed giving her clearance.  Tried ibuprofen 800mg  BID without much help, so stopped--only used it for 3-4 days.  Has used muscle relaxers for fibromyalgia pain in past (without benefit)--hasn't tried recently.  Past Medical History  Diagnosis Date  . Breast cancer age 56, recurrent 2010  . Allergy   . Anxiety   . Depression   . OSA (obstructive sleep apnea)     sleep study 6/09  . Fibromyalgia 2/07  . Hypertension 2006  . Viral meningitis 5/99  . Tricuspid regurgitation 6/02    mild  . Breast cancer    Past Surgical History  Procedure Date  . Breast surgery     extended partial R mastectomy with LND age 37; bilateral mastectomy 11/2008, multiple reconstructive surgeries 2011-2012  . Abdominal hysterectomy 03/2009    and BSO; Dr. Stefano Gaul  . Mastectomy   . Vesicovaginal fistula closure w/  [place]    History   Social History  . Marital Status: Married    Spouse Name: N/A    Number of Children: N/A  . Years of Education: N/A   Occupational History  . Not on file.   Social History Main Topics  . Smoking status: Never Smoker   . Smokeless tobacco: Not on file  . Alcohol Use: Yes     1-2 drinks once or twice a month.  . Drug Use: No  . Sexually Active: Not on file   Other Topics Concern  . Not on file   Social History Narrative  . No narrative on file   Current Outpatient Prescriptions on File Prior to Visit  Medication Sig Dispense Refill  . ALPRAZolam (XANAX) 1 MG tablet Take 1 tablet (1 mg total) by mouth 3 (three) times daily as needed for sleep or anxiety.  135 tablet  0  . citalopram (CELEXA) 40 MG tablet TAKE ONE TABLET BY MOUTH EVERY DAY  90 tablet  1  . pregabalin (LYRICA) 75 MG capsule 1 cap twice daily, after a week can titrate up to 2 capsules twice daily  56 capsule  0  (Lyrica NOT started prior to this visit--started AT visit)  No Known Allergies  ROS:  No fevers, chills, GI complaints, chest pain, shortness of breath. No numbness/tingling/weakness.  No URI symptoms, rashes or other concerns  PHYSICAL EXAM:  BP 138/84  Pulse 68  Ht 5\' 4"  (1.626 m)  Wt 210 lb (95.255 kg)  BMI 36.05 kg/m2 Pleasant, obese female, in no distress.  Has mild discomfort with position changes, and severe pain upon palpating specific areas. Neck: no spinal tenderness, no trapezius tenderness, spasm or trigger points.   Very tender/hypersensitive to palpation at mid lumbar spine and paraspinous muscles. No spasm. Also with point tenderness at bilateral trochanteric bursa, and SI joints bilaterally Heart: regular rate and rhythm Lungs: clear Abdomen: soft, nontender Extremities: no edema  ASSESSMENT/PLAN: 1. Fibromyalgia  meloxicam (MOBIC) 15 MG tablet, HYDROcodone-acetaminophen (VICODIN) 5-500 MG per tablet, pregabalin (LYRICA) 75 MG capsule  2. Back pain  meloxicam  (MOBIC) 15 MG tablet, HYDROcodone-acetaminophen (VICODIN) 5-500 MG per tablet  Likely has a component of bilateral trochanteric bursitis (vs just being a trigger point of her fibromyalgia).  Therefore, recommend trial of NSAID (will help bursitis and knee arthritis--may not help with other fibro pain).  meloxicam once daily--take with food once daily.  In a few days, if no side effects from meloxicam, start the Lyrica.   Take 75 mg twice daily for a week, then increase to 75 mg in the morning and 150 (2 tabs) at bedtime, then increase to 150 mg twice daily (if tolerated).  Okay to start exercise program--start out very slowly. Water exercises recommended.    If you continue to have ongoing pain, might need to refer back for PT--can check if [facility] takes your current insurance.  Use the Vicodin sparingly, for severe pain (doesn't fix anything, just masks the pain)--consider taking it at bedtime to prevent pain from interrupting sleep (and less risk for daytime sedation from meds)  F/u 4-6 weeks, sooner prn.  Visit time 30 minutes, more than 1/2 counseling

## 2011-06-26 NOTE — Patient Instructions (Signed)
meloxicam once daily--take with food.  Do not use other anti-inflammatories (ie no ibuprofen, aleve or others). It is okay to take Tylenol for pain, but not with vicodin--as vicodin contains tylenol.  In a few days, if no side effects from meloxicam, start the Lyrica.   Take 75 mg twice daily for a week, then increase to 75 mg in the morning and 150 (2 tabs) at bedtime, then increase to 150 mg twice daily (if tolerated).  Okay to start exercise program--start out very slowly. Water exercises recommended.    If you continue to have ongoing pain, might need to refer back for PT--can check if [facility] takes your current insurance.  Use the Vicodin sparingly, for severe pain (doesn't fix anything, just masks the pain)

## 2011-07-09 ENCOUNTER — Telehealth: Payer: Self-pay | Admitting: Family Medicine

## 2011-07-09 MED ORDER — ALBUTEROL SULFATE HFA 108 (90 BASE) MCG/ACT IN AERS: 2.0000 | INHALATION_SPRAY | Freq: Four times a day (QID) | RESPIRATORY_TRACT | Status: DC | PRN

## 2011-07-09 NOTE — Telephone Encounter (Signed)
Her body pain is much improved on meloxicam, but she is having some chest pain.  She is in [place], and having some exertional chest pain, further described as wheezing and tightness in her chest/throat/neck.  Advised that possibilities include GI side effects of NSAID, RAD/wheezing, vs cardiac etiology of exertional chest pain.  Given that she is out of town, prefers to try albuterol inhaler and prilosec. Will need cardiac evaluation if persistent exertional chest pain/dyspnea despite these measures. Rx phoned in for albuterol.

## 2011-07-10 ENCOUNTER — Other Ambulatory Visit: Payer: Self-pay | Admitting: *Deleted

## 2011-07-10 MED ORDER — ALBUTEROL SULFATE HFA 108 (90 BASE) MCG/ACT IN AERS: 2.0000 | INHALATION_SPRAY | Freq: Four times a day (QID) | RESPIRATORY_TRACT | Status: DC | PRN

## 2011-07-14 ENCOUNTER — Telehealth: Payer: Self-pay | Admitting: Internal Medicine

## 2011-07-14 NOTE — Telephone Encounter (Signed)
Needs to schedule OV (will need EKG, +/- CXR, +/- referral, but needs eval here)

## 2011-07-14 NOTE — Telephone Encounter (Signed)
Pt is going to see [facility] tomorrow @ 11:30.

## 2011-07-15 ENCOUNTER — Encounter: Payer: Self-pay | Admitting: Medical

## 2011-07-15 ENCOUNTER — Ambulatory Visit
Admission: RE | Admit: 2011-07-15 | Discharge: 2011-07-15 | Disposition: A | Discharge status: Home / Self Care | Payer: PRIVATE HEALTH INSURANCE | Source: Ambulatory Visit | Attending: Medical | Admitting: Medical

## 2011-07-15 ENCOUNTER — Ambulatory Visit (INDEPENDENT_AMBULATORY_CARE_PROVIDER_SITE_OTHER): Payer: PRIVATE HEALTH INSURANCE | Admitting: Medical

## 2011-07-15 ENCOUNTER — Telehealth: Payer: Self-pay | Admitting: Family Medicine

## 2011-07-15 VITALS — BP 128/80 | HR 60 | Temp 98.1°F | Resp 16 | Wt 216.0 lb

## 2011-07-15 DIAGNOSIS — R0609 Other forms of dyspnea: Secondary | ICD-10-CM

## 2011-07-15 DIAGNOSIS — R079 Chest pain, unspecified: Secondary | ICD-10-CM

## 2011-07-15 DIAGNOSIS — R0989 Other specified symptoms and signs involving the circulatory and respiratory systems: Secondary | ICD-10-CM

## 2011-07-15 MED ORDER — NITROGLYCERIN 0.4 MG SL SUBL: 0.4000 mg | SUBLINGUAL_TABLET | SUBLINGUAL | Status: DC | PRN

## 2011-07-15 NOTE — Telephone Encounter (Signed)
Message copied by Janeice Robinson on Tue Jul 15, 2011  4:14 PM ------      Message from: Jac Canavan      Created: Tue Jul 15, 2011  2:49 PM       cxr normal.  Pls call her with the referral info to Dr. Donnie Aho.  Send EKG, CXR, OV notes, labs which will be back tomorrow.

## 2011-07-15 NOTE — Progress Notes (Signed)
Subjective:   HPI  Allison Valencia is a 56 y.o. female who presents for c/o chest pain.  She went to [place] on vacation last week, and while trying to walk around and tour the city started getting chest pain and difficulty breathing.  She was having a hard time keeping up with her husband.  She describes mid chest pain, substernal, worse with walking and activity in general.  Getting a tightness in her neck, feels short of breath with activity.  Any exertion seems to worsen the symptoms.  She denies associated nausea, sweats, numbness, radiating pain, edema, or dizziness.  The pain was bad enough that she felt a lack of appetite.  She called here to Dr. Lynelle Doctor last week regarding the symptoms.  She was prescribed Mobic a few weeks ago and she started getting the chest pains a few days after starting Mobic, thought this could be related.  She was advised if symptoms worsened or persisted to be seen in the ED in [place].  She decided to f/u here.  She is still taking the Mobic as it has made a big difference in her pain.  She was prescribed a trial of Prilosec and inhaler to see if this helped the pain at all.  The inhaler did not, and the Prilosec gave only marginal improvement.    She denies pain after eating, no nausea, vomiting, belching or gas.   No association with the symptoms and eating.  No other prior similar symptoms.  She does note on several occasions in the past being told there was something wrong with her heart.  Had echocardiogram about 10 years ago, may have had a valve issue.  She was on hypertension medication in the past.  She is concerned about her heart since her father died of MI at age 41yo, and she notes most males on paternal side have died in their 2s or younger with heart related problems.  No other aggravating or relieving factors.    No other c/o.  The following portions of the patient's history were reviewed and updated as appropriate: allergies, current medications, past  family history, past medical history, past social history, past surgical history and problem list.  Past Medical History  Diagnosis Date  . Breast cancer age 8, recurrent 2010  . Allergy   . Anxiety   . Depression   . OSA (obstructive sleep apnea)     sleep study 6/09  . Fibromyalgia 2/07  . Hypertension 2006  . Viral meningitis 5/99  . Tricuspid regurgitation 6/02    mild  . Breast cancer     No Known Allergies   Review of Systems ROS reviewed and was negative other than noted in HPI or above.    Objective:   Physical Exam  General appearance: alert, no distress, WD/WN, obese white female Skin: warm, dry Oral cavity: MMM, no lesions Neck: supple, no lymphadenopathy, no thyromegaly, no masses Heart: RRR, normal S1, S2, no murmurs Lungs: CTA bilaterally, no wheezes, rhonchi, or rales Abdomen: +bs, soft, non tender, non distended, no masses, no hepatomegaly, no splenomegaly Pulses: 2+ symmetric, upper and lower extremities, normal cap refill   Adult ECG Report  Indication: chest pain  Rate: 58bpm  Rhythm: sinus bradycardia  QRS Axis: 42 degrees  PR Interval:  QRS Duration: 90ms  QTc:  Conduction Disturbances: slightly biphasic P in V2, questionable atrial enlargement  Other Abnormalities: none  Patient's cardiac risk factors are: family history of premature cardiovascular disease, hypertension  and obesity (BMI >= 30 kg/m2).  EKG comparison: none  Narrative Interpretation: sinus bradycardia, questionable atrial enlargement, otherwise unremarkable  Assessment and Plan :     Encounter Diagnoses  Name Primary?  . Chest pain Yes  . DOE (dyspnea on exertion)    Discussed her symptoms and concerns.  She has strong family hx/o hearts, hx/o hypertension not on medication, but controlled.  Possible differential includes cardiac, cholecystitis, adverse reaction to Mobic, or other.  However, can't rule out cardiac.  Thus, reviewed EKG today, will send for CXR  today, labs today and she will f/u with Dr. Tilley/Cardiology tomorrow.

## 2011-07-15 NOTE — Telephone Encounter (Signed)
PATIENT WAS NOTIFIED OF HER CXR REPORT. CLS I FAX OVER OV NOTES, DEMOGRAPHICS, AND EKG TO DR. TILLEY. CLS

## 2011-07-15 NOTE — Telephone Encounter (Signed)
PATIENT IS AWARE OF HER APPOINTMENT ON 07/15/11 BY PHONE. CLS  DR. TILLEY [address] [place],  07/15/11 @ 930 AM WITH DR. TILLEY CLS

## 2011-07-16 LAB — CBC WITH DIFFERENTIAL/PLATELET
Basophils Absolute: 0 10*3/uL (ref 0.0–0.1)
HCT: 37.3 % (ref 36.0–46.0)
Hemoglobin: 12.3 g/dL (ref 12.0–15.0)
Lymphocytes Relative: 28 % (ref 12–46)
Lymphs Abs: 1.7 10*3/uL (ref 0.7–4.0)
Monocytes Absolute: 0.3 10*3/uL (ref 0.1–1.0)
Monocytes Relative: 5 % (ref 3–12)
Neutro Abs: 3.9 10*3/uL (ref 1.7–7.7)
RBC: 4.36 MIL/uL (ref 3.87–5.11)
RDW: 15 % (ref 11.5–15.5)
WBC: 6.1 10*3/uL (ref 4.0–10.5)

## 2011-07-16 LAB — COMPREHENSIVE METABOLIC PANEL
BUN: 22 mg/dL (ref 6–23)
CO2: 24 mEq/L (ref 19–32)
Calcium: 9.1 mg/dL (ref 8.4–10.5)
Chloride: 104 mEq/L (ref 96–112)
Creat: 0.94 mg/dL (ref 0.50–1.10)
Glucose, Bld: 76 mg/dL (ref 70–99)

## 2011-07-18 ENCOUNTER — Other Ambulatory Visit: Payer: Self-pay | Admitting: Cardiology

## 2011-07-18 DIAGNOSIS — Z853 Personal history of malignant neoplasm of breast: Secondary | ICD-10-CM

## 2011-07-18 DIAGNOSIS — R079 Chest pain, unspecified: Secondary | ICD-10-CM

## 2011-07-18 DIAGNOSIS — E669 Obesity, unspecified: Secondary | ICD-10-CM | POA: Insufficient documentation

## 2011-07-18 NOTE — Progress Notes (Signed)
Allison Valencia    Date of visit:  07/18/2011 DOB:  03-15-1955    Age:  56 yrs. Medical record number:  [id number]     Account number:  [id number] Primary Care Provider: KNAPP, EVE ____________________________ CURRENT DIAGNOSES  1. Shortness of Breath  2. Chest Pain  3. Personal History Of Malignant Neoplasm Of Breast  4. Obesity(BMI30-40) ____________________________ ALLERGIES  NKDA ____________________________ MEDICATIONS  1. albuterol sulfate 90 mcg/actuation HFA Aerosol Inhaler, PRN  2. citalopram 40 mg tablet, 1 p.o. daily  3. omeprazole 10 mg capsule,delayed release(DR/EC), 1 p.o. daily  4. nitroglycerin 0.4 mg tablet, sublingual, PRN  5. metoprolol tartrate 25 mg tablet, BID  6. aspirin 81 mg tablet, chewable, 1 p.o. daily ____________________________ CHIEF COMPLAINTS  Chest pain with exertion + dyspnea ____________________________ HISTORY OF PRESENT ILLNESS  This very nice 56 year old female is seen for evaluation of chest tightness and dyspnea. The patient has a history of breast cancer at age 23 treated with surgery and radiation therapy. This was 24 years ago. She has been obese and has gained some weight. She had a recurrence of breast cancer the past couple of years and has had mastectomies as well as hysterectomy and several reconstructive surgeries. She also has a history of fibromyalgia but has been treated with meloxicam that she has tolerated fairly well and has improved her symptoms.  She was recently on a trip to [place] with her husband and she noticed exertional dyspnea as well as substernal chest tightness that would improve with rest. She had an inhaler called down and all return here was seen by Kristian Covey who was concerned that she might be having angina. The symptoms have mostly been exertional and would resolve with rest. She describes as pressure or heaviness but is not terribly severe. She denies PND, orthopnea, syncope, palpitations, or claudication. She  does have some situational stress with a daughter who is living with her who has been having some mental health and situational issues.  ____________________________ PAST HISTORY  Past Medical Illnesses:  anxiety, fibromyalgia, insomnia, breast cancer treated with surgery, radiation age 32, second occurence treated with surgery, depression, hypertension, obesity;  Cardiovascular Illnesses:  no previous history of cardiac disease.;  Surgical Procedures:  tonsillectomy, tubal ligation, rt partial mastectomy, breast mastectomy rt, breast mastectomy left, hysterectomy, tram flap, Reconstructive breast surgery;  Cardiology Procedures-Invasive:  no history of prior cardiac procedures;  Cardiology Procedures-Noninvasive:  echocardiogram;  LVEF not documented ____________________________ CARDIO-PULMONARY TEST DATES EKG Date:  07/18/2011;  Chest Xray Date: 07/15/2011;   ____________________________ FAMILY HISTORY Father - age 26,  diabetes-unknown type and  died of myocardial infarction; Mother - age 55,  alive and well and phlebitis; Brother 1 - age 62,  alive and well;  ____________________________ SOCIAL HISTORY Alcohol Use:  socially;  Smoking:  never smoked;  Diet:  regular diet;  Lifestyle:  married and 2 children;  Exercise:  no regular exercise;  Occupation:  homemaker;  Residence:  lives with husband;   ____________________________ REVIEW OF SYSTEMS General:  weight gain of approximately 20 lbs  Integumentary:  heat rash Eyes:  denies diplopia, history of glaucoma or visual problems.  Ears, Nose, Throat, Mouth:  denies any hearing loss, epistaxis, hoarseness or difficulty speaking.  Respiratory:  denies dyspnea, cough, wheezing or hemoptysis.  Cardiovascular:  please review HPI  Abdominal:  denies dyspepsia, GI bleeding, constipation, or diarrhea  Genitourinary-Female:  no dysuria, urgency, frequency, UTIs, or stress incontinence  Musculoskeletal:  fibromyalgia  Neurological:  denies headaches,  stroke,  or TIA. Psychiatric:  situational stress, mild depression in the past  Hematological/Immunologic:  Has taken allergy shots in the past ____________________________ PHYSICAL EXAMINATION VITAL SIGNS  Blood Pressure:  120/80 Sitting, Left arm, large cuff  , 124/78 Standing, Left arm and large cuff   Pulse:  88/min. Weight:  211.00 lbs. Height:  65"BMI: 35  Constitutional:  pleasant white female, in no acute distress, moderately obese Skin:  warm and dry to touch, no apparent skin lesions, or masses noted. Head:  normocephalic, normal hair pattern, no masses or tenderness Eyes:  EOMS Intact, PERRLA, C and S clear, Funduscopic exam not done. ENT:  ears, nose and throat reveal no gross abnormalities.  Dentition good. Neck:  supple, without massess. No JVD, thyromegaly or carotid bruits. Carotid upstroke normal. Chest:  normal symmetry, clear to auscultation and percussion. Cardiac:  regular rhythm, normal S1 and S2, No S3 or S4, no murmurs, gallops or rubs detected. Abdomen:  abdomen soft,non-tender, no masses, no hepatospenomegaly, or aneurysm noted Peripheral Pulses:  the femoral,dorsalis pedis, and posterior tibial pulses are full and equal bilaterally with no bruits auscultated. Extremities & Back:  no deformities, clubbing, cyanosis, erythema or edema observed. Normal muscle strength and tone. Neurological:  no gross motor or sensory deficits noted, affect appropriate, oriented x3. ____________________________ IMPRESSIONS/PLAN  1. Chest discomfort with features suggestive of exertional angina. The patient is at risk because of radiation therapy to her chest area 24 years ago as well as a family history of heart disease and obesity. 2. Obesity with weight gain 3. History of breast cancer treated with surgery and radiation therapy 4. History of fibromyalgia 5. Situational stress  Recommendations:  I asked her to initiate metoprolol 25 mg twice daily. She should also begin to take  aspirin. She may use aspirin if needed for arthritis but I would prefer for her to stay away from meloxicam for the time being because of potential cardiovascular effects. She may use nitroglycerin if she has severe chest discomfort and I will see her early next week for a standard treadmill test since her baseline EKG is normal. I would like for her also to have a d-dimer and BNP level. Review of her previous chest x-ray and lab was normal.  ____________________________ TODAYS ORDERS  1. treadmill:  Regular TM First Available  2. D-dimer: today  3. BNP: Today  4. 12 Lead EKG: Today                       ____________________________ Cardiology Physician:  Darden Palmer MD [facility]

## 2011-07-22 ENCOUNTER — Encounter (HOSPITAL_COMMUNITY): Payer: Self-pay | Admitting: Pharmacy Technician

## 2011-07-22 ENCOUNTER — Other Ambulatory Visit: Payer: Self-pay | Admitting: Cardiology

## 2011-07-22 NOTE — H&P (Signed)
Dede Query    Date of visit:  07/22/2011 DOB:  1955-03-07    Age:  56 yrs. Medical record number:  [id number]     Account number:  [id number] Primary Care Provider: KNAPP, EVE ____________________________ CURRENT DIAGNOSES  1. Shortness of Breath  2. Chest Pain  3. Personal History Of Malignant Neoplasm Of Breast  4. Obesity(BMI30-40)  5. Angina Pectoris ____________________________ ALLERGIES  NKDA ____________________________ MEDICATIONS  1. albuterol sulfate 90 mcg/actuation HFA Aerosol Inhaler, PRN  2. citalopram 40 mg tablet, 1 p.o. daily  3. omeprazole 10 mg capsule,delayed release(DR/EC), 1 p.o. daily  4. nitroglycerin 0.4 mg tablet, sublingual, PRN  5. metoprolol tartrate 25 mg tablet, BID  6. aspirin 81 mg tablet, chewable, 1 p.o. daily ____________________________ HISTORY OF PRESENT ILLNESS  This very nice 56 year old female is brought in for cardiac catheterization. The patient has a history of breast cancer at age 66 treated with surgery and radiation therapy. This was 24 years ago. She has been obese and has gained some weight. She had a recurrence of breast cancer the past couple of years and has had mastectomies as well as hysterectomy and several reconstructive surgeries. She also has a history of fibromyalgia but has been treated with meloxicam that she has tolerated fairly well and has improved her symptoms.  She was recently on a trip to [place] with her husband and she noticed exertional dyspnea as well as substernal chest tightness that would improve with rest. She had an inhaler called in and on return here was seen by Kristian Covey who was concerned that she might be having angina. The symptoms have mostly been exertional and would resolve with rest. She describes it as pressure or heaviness but is not terribly severe. She denies PND, orthopnea, syncope, palpitations, or claudication. She does have some situational stress with a daughter who is living with her who  has been having some mental health and situational issues.  She was seen initially on the 21st and was placed on metoprolol and scheduled for a treadmill test. She has had some continued exertional symptoms since then. She only walked 5:15 minutes on the Bruce protocol and had the development of an exercise-induced left bundle branch block as well as some clinical angina.  Cardiac catheterization is advised to assess for coronary artery disease in light of her recent onset of symptoms. ____________________________ PAST HISTORY  Past Medical Illnesses:  anxiety, fibromyalgia, insomnia, breast cancer treated with surgery, radiation age 9, second occurence treated with surgery, depression, hypertension, obesity;  Cardiovascular Illnesses:  exercise-induced left bundle branch block;  Surgical Procedures:  tonsillectomy, tubal ligation, rt partial mastectomy, breast mastectomy rt, breast mastectomy left, hysterectomy, tram flap, Reconstructive breast surgery;  Cardiology Procedures-Invasive:  no history of prior cardiac procedures;  Cardiology Procedures-Noninvasive:  echocardiogram, treadmill June 2013;  LVEF not documented ____________________________ CARDIO-PULMONARY TEST DATES EKG Date:  07/18/2011;  Chest Xray Date: 07/15/2011;   ____________________________ SOCIAL HISTORY Alcohol Use:  socially;  Smoking:  never smoked;  Diet:  regular diet;  Lifestyle:  married and 2 children;  Exercise:  no regular exercise;  Occupation:  homemaker;  Residence:  lives with husband;   ____________________________ REVIEW OF SYSTEMS General:  weight gain of approximately 20 lbs  Ears, Nose, Throat, Mouth:  denies any hearing loss, epistaxis, hoarseness or difficulty speaking.  Respiratory:  denies dyspnea, cough, wheezing or hemoptysis.  Cardiovascular:  please review HPI  Abdominal:  denies dyspepsia, GI bleeding, constipation, or diarrhea  Genitourinary-Female:  no dysuria, urgency, frequency,  UTIs, or stress  incontinence  Musculoskeletal:  fibromyalgia  Psychiatric:  situational stress  Hematological/Immunologic:  environmental allergies ____________________________ PHYSICAL EXAMINATION VITAL SIGNS  Blood pressure 130/80 lying, pulse 64  Constitutional:  pleasant white female, in no acute distress, moderately obese Skin:  warm and dry to touch, no apparent skin lesions, or masses noted. Head:  normocephalic, normal hair pattern, no masses or tenderness Eyes:  EOMS Intact, PERRLA, C and S clear, Funduscopic exam not done. ENT:  ears, nose and throat reveal no gross abnormalities.  Dentition good. Neck:  supple, without massess. No JVD, thyromegaly or carotid bruits. Carotid upstroke normal. Chest:  normal symmetry, clear to auscultation and percussion. Cardiac:  regular rhythm, normal S1 and S2, No S3 or S4, no murmurs, gallops or rubs detected. Abdomen:  abdomen soft,non-tender, no masses, no hepatospenomegaly, or aneurysm noted Peripheral Pulses:  the femoral,dorsalis pedis, and posterior tibial pulses are full and equal bilaterally with no bruits auscultated. Extremities & Back:  no deformities, clubbing, cyanosis, erythema or edema observed. Normal muscle strength and tone. Neurological:  no gross motor or sensory deficits noted, affect appropriate, oriented x3. ____________________________ MOST RECENT LIPID PANEL 07/22/11  CHOL TOTL 182 mg/dl, LDL 454 calc, HDL 41 mg/dl, TRIGLYCER 70 mg/dl and CHOL/HDL 4.5 (Calc) ____________________________ IMPRESSIONS/PLAN  1. New onset of substernal chest discomfort features consistent with progressive angina 2. Exercise-induced left bundle branch block 3. History of breast cancer with 2 separate occurrences 4. Obesity with weight gain 5. Mild elevation of LDL cholesterol  Recommendations:  Cardiac catheterization was discussed with the patient fully including risks of myocardial infarction, stroke, death, bleeding, arrhythmia, dye allergy, renal  insufficiency. She understands and is willing to proceed. Possibility of percutaneous intervention to be done by an interventional cardiologist also discussed. Risks of this were discussed. She is agreeable and willing to proceed.  _________________________ TODAYS ORDERS  1. Left Heart Cath/Poss PCI: 2 days  2. Draw PT/INR: Today  3. PTT: Today                       ____________________________ Cardiology Physician:  Darden Palmer MD [facility]

## 2011-07-24 ENCOUNTER — Encounter (HOSPITAL_COMMUNITY)
Admission: RE | Disposition: A | Discharge status: Home / Self Care | Payer: Self-pay | Source: Ambulatory Visit | Attending: Cardiology

## 2011-07-24 ENCOUNTER — Ambulatory Visit (HOSPITAL_COMMUNITY)
Admission: RE | Admit: 2011-07-24 | Discharge: 2011-07-24 | Disposition: A | Discharge status: Home / Self Care | Payer: PRIVATE HEALTH INSURANCE | Source: Ambulatory Visit | Attending: Cardiology | Admitting: Cardiology

## 2011-07-24 ENCOUNTER — Encounter: Payer: Self-pay | Admitting: Internal Medicine

## 2011-07-24 DIAGNOSIS — R079 Chest pain, unspecified: Secondary | ICD-10-CM | POA: Insufficient documentation

## 2011-07-24 DIAGNOSIS — I2510 Atherosclerotic heart disease of native coronary artery without angina pectoris: Secondary | ICD-10-CM

## 2011-07-24 DIAGNOSIS — I447 Left bundle-branch block, unspecified: Secondary | ICD-10-CM | POA: Insufficient documentation

## 2011-07-24 HISTORY — PX: LEFT HEART CATHETERIZATION WITH CORONARY ANGIOGRAM: SHX5451

## 2011-07-24 LAB — PROTIME-INR
INR: 1 (ref 0.00–1.49)
Prothrombin Time: 13.4 seconds (ref 11.6–15.2)

## 2011-07-24 SURGERY — LEFT HEART CATHETERIZATION WITH CORONARY ANGIOGRAM
Anesthesia: LOCAL

## 2011-07-24 MED ORDER — SODIUM CHLORIDE 0.9 % IV SOLN
1.0000 mL/kg/h | INTRAVENOUS | Status: DC
  Administered 2011-07-24: 1 mL/kg/h via INTRAVENOUS

## 2011-07-24 MED ORDER — HEPARIN (PORCINE) IN NACL 2-0.9 UNIT/ML-% IJ SOLN
INTRAMUSCULAR | Status: AC
  Filled 2011-07-24: qty 2000

## 2011-07-24 MED ORDER — MIDAZOLAM HCL 2 MG/2ML IJ SOLN
INTRAMUSCULAR | Status: AC
  Filled 2011-07-24: qty 2

## 2011-07-24 MED ORDER — DIAZEPAM 5 MG PO TABS
10.0000 mg | ORAL_TABLET | ORAL | Status: AC
  Administered 2011-07-24: 10 mg via ORAL
  Filled 2011-07-24: qty 2

## 2011-07-24 MED ORDER — LIDOCAINE HCL (PF) 1 % IJ SOLN
INTRAMUSCULAR | Status: AC
  Filled 2011-07-24: qty 30

## 2011-07-24 MED ORDER — ASPIRIN 81 MG PO CHEW
324.0000 mg | CHEWABLE_TABLET | ORAL | Status: AC
  Administered 2011-07-24: 324 mg via ORAL
  Filled 2011-07-24: qty 4

## 2011-07-24 MED ORDER — NITROGLYCERIN 0.2 MG/ML ON CALL CATH LAB
INTRAVENOUS | Status: AC
  Filled 2011-07-24: qty 1

## 2011-07-24 MED ORDER — SODIUM CHLORIDE 0.9 % IV SOLN
INTRAVENOUS | Status: DC
  Administered 2011-07-24: 07:00:00 via INTRAVENOUS

## 2011-07-24 MED ORDER — ACETAMINOPHEN 325 MG PO TABS: 650.0000 mg | ORAL_TABLET | ORAL | Status: DC | PRN

## 2011-07-24 NOTE — CV Procedure (Signed)
Cardiac Catheterization Report   Allison Valencia    56 y.o.  female  DOB: [birth year]  MRN: [id number]  07/24/2011   PROCEDURE:  Left heart catheterization with selective coronary angiography, left ventriculogram.  INDICATIONS:  Exertional chest pain with an exercise-induced left bundle branch block. The risks, benefits, and details of the procedure were explained to the patient.  The patient verbalized understanding and wanted to proceed.  Informed written consent was obtained.  PROCEDURE TECHNIQUE:  After Xylocaine anesthesia a 61F sheath was placed in the right femoral artery with a single anterior needle wall stick.   Versed 4 mg was used for sedation. Left coronary angiography was done using a Judkins L4 guide catheter.  Right coronary angiography was done using a Judkins R4 guide catheter.  A 30 cc ventriculogram was performed in the 30 degree RAO projection. She tolerated the procedure well.  Sheath was removed in the holding area.   CONTRAST:  Total of 70 cc.  COMPLICATIONS:  None.    HEMODYNAMICS:  Aortic postcontrast 134/67 LV postcontrast 134/10-15.  There was no gradient between the left ventricle and aorta.    ANGIOGRAPHIC DATA:    CORONARY ARTERIES:   Arise and distribute normally.  Right dominant. Mild coronary calcification is noted of the proximal LAD.Marland Kitchen  Left main coronary artery: Normal.  Left anterior descending: Mild calcification noted with scattered irregularities. The LAD terminates on the distal anterior wall..  Circumflex coronary artery: Marginal branches arise that are free of disease.  Right coronary artery: Large dominant vessel. The posterior descending and posterior lateral branch supply the apex. Scattered irregularities are noted.Marland Kitchen  LEFT VENTRICULOGRAM:  Performed in the 30 RAO projection.  The aortic and mitral valves are normal. The left ventricle is normal in size with normal wall motion. Estimated ejection  fraction is 65%..   IMPRESSIONS:  1. Scattered irregularities noted in coronary arteries with mild calcification of the LAD but no significant obstructive stenoses noted. 2. Normal left ventricle.Marland Kitchen  RECOMMENDATION:  The patient has an exercise-induced left bundle branch block. This may account for her symptoms. She will be treated with beta blocker, weight loss and exercise.Darden Palmer MD [facility]

## 2011-07-24 NOTE — H&P (View-Only) (Signed)
Niblack, Jaedynn    Date of visit:  07/22/2011 DOB:  06/04/1955    Age:  56 yrs. Medical record number:  [id number]     Account number:  [id number] Primary Care Provider: KNAPP, EVE ____________________________ CURRENT DIAGNOSES  1. Shortness of Breath  2. Chest Pain  3. Personal History Of Malignant Neoplasm Of Breast  4. Obesity(BMI30-40)  5. Angina Pectoris ____________________________ ALLERGIES  NKDA ____________________________ MEDICATIONS  1. albuterol sulfate 90 mcg/actuation HFA Aerosol Inhaler, PRN  2. citalopram 40 mg tablet, 1 p.o. daily  3. omeprazole 10 mg capsule,delayed release(DR/EC), 1 p.o. daily  4. nitroglycerin 0.4 mg tablet, sublingual, PRN  5. metoprolol tartrate 25 mg tablet, BID  6. aspirin 81 mg tablet, chewable, 1 p.o. daily ____________________________ HISTORY OF PRESENT ILLNESS  This very nice 56-year-old female is brought in for cardiac catheterization. The patient has a history of breast cancer at age 32 treated with surgery and radiation therapy. This was 24 years ago. She has been obese and has gained some weight. She had a recurrence of breast cancer the past couple of years and has had mastectomies as well as hysterectomy and several reconstructive surgeries. She also has a history of fibromyalgia but has been treated with meloxicam that she has tolerated fairly well and has improved her symptoms.  She was recently on a trip to [place] with her husband and she noticed exertional dyspnea as well as substernal chest tightness that would improve with rest. She had an inhaler called in and on return here was seen by Shane Tysinger who was concerned that she might be having angina. The symptoms have mostly been exertional and would resolve with rest. She describes it as pressure or heaviness but is not terribly severe. She denies PND, orthopnea, syncope, palpitations, or claudication. She does have some situational stress with a daughter who is living with her who  has been having some mental health and situational issues.  She was seen initially on the 21st and was placed on metoprolol and scheduled for a treadmill test. She has had some continued exertional symptoms since then. She only walked 5:15 minutes on the Bruce protocol and had the development of an exercise-induced left bundle branch block as well as some clinical angina.  Cardiac catheterization is advised to assess for coronary artery disease in light of her recent onset of symptoms. ____________________________ PAST HISTORY  Past Medical Illnesses:  anxiety, fibromyalgia, insomnia, breast cancer treated with surgery, radiation age 32, second occurence treated with surgery, depression, hypertension, obesity;  Cardiovascular Illnesses:  exercise-induced left bundle branch block;  Surgical Procedures:  tonsillectomy, tubal ligation, rt partial mastectomy, breast mastectomy rt, breast mastectomy left, hysterectomy, tram flap, Reconstructive breast surgery;  Cardiology Procedures-Invasive:  no history of prior cardiac procedures;  Cardiology Procedures-Noninvasive:  echocardiogram, treadmill June 2013;  LVEF not documented ____________________________ CARDIO-PULMONARY TEST DATES EKG Date:  07/18/2011;  Chest Xray Date: 07/15/2011;   ____________________________ SOCIAL HISTORY Alcohol Use:  socially;  Smoking:  never smoked;  Diet:  regular diet;  Lifestyle:  married and 2 children;  Exercise:  no regular exercise;  Occupation:  homemaker;  Residence:  lives with husband;   ____________________________ REVIEW OF SYSTEMS General:  weight gain of approximately 20 lbs  Ears, Nose, Throat, Mouth:  denies any hearing loss, epistaxis, hoarseness or difficulty speaking.  Respiratory:  denies dyspnea, cough, wheezing or hemoptysis.  Cardiovascular:  please review HPI  Abdominal:  denies dyspepsia, GI bleeding, constipation, or diarrhea  Genitourinary-Female:  no dysuria, urgency, frequency,   UTIs, or stress  incontinence  Musculoskeletal:  fibromyalgia  Psychiatric:  situational stress  Hematological/Immunologic:  environmental allergies ____________________________ PHYSICAL EXAMINATION VITAL SIGNS  Blood pressure 130/80 lying, pulse 64  Constitutional:  pleasant white female, in no acute distress, moderately obese Skin:  warm and dry to touch, no apparent skin lesions, or masses noted. Head:  normocephalic, normal hair pattern, no masses or tenderness Eyes:  EOMS Intact, PERRLA, C and S clear, Funduscopic exam not done. ENT:  ears, nose and throat reveal no gross abnormalities.  Dentition good. Neck:  supple, without massess. No JVD, thyromegaly or carotid bruits. Carotid upstroke normal. Chest:  normal symmetry, clear to auscultation and percussion. Cardiac:  regular rhythm, normal S1 and S2, No S3 or S4, no murmurs, gallops or rubs detected. Abdomen:  abdomen soft,non-tender, no masses, no hepatospenomegaly, or aneurysm noted Peripheral Pulses:  the femoral,dorsalis pedis, and posterior tibial pulses are full and equal bilaterally with no bruits auscultated. Extremities & Back:  no deformities, clubbing, cyanosis, erythema or edema observed. Normal muscle strength and tone. Neurological:  no gross motor or sensory deficits noted, affect appropriate, oriented x3. ____________________________ MOST RECENT LIPID PANEL 07/22/11  CHOL TOTL 182 mg/dl, LDL 128 calc, HDL 41 mg/dl, TRIGLYCER 70 mg/dl and CHOL/HDL 4.5 (Calc) ____________________________ IMPRESSIONS/PLAN  1. New onset of substernal chest discomfort features consistent with progressive angina 2. Exercise-induced left bundle branch block 3. History of breast cancer with 2 separate occurrences 4. Obesity with weight gain 5. Mild elevation of LDL cholesterol  Recommendations:  Cardiac catheterization was discussed with the patient fully including risks of myocardial infarction, stroke, death, bleeding, arrhythmia, dye allergy, renal  insufficiency. She understands and is willing to proceed. Possibility of percutaneous intervention to be done by an interventional cardiologist also discussed. Risks of this were discussed. She is agreeable and willing to proceed.  _________________________ TODAYS ORDERS  1. Left Heart Cath/Poss PCI: 2 days  2. Draw PT/INR: Today  3. PTT: Today                       ____________________________ Cardiology Physician:  W. Spencer Aracelia Brinson, Jr. MD [facility]     

## 2011-07-24 NOTE — Interval H&P Note (Signed)
History and Physical Interval Note:  07/24/2011 7:38 AM  Allison Valencia  has presented today for surgery, with the diagnosis of chest pain and abnormal treadmill.  The various methods of treatment have been discussed with the patient and family. After consideration of risks, benefits and other options for treatment, the patient has consented to  Cardiac cath with possible PCI .  The patient's history has been reviewed, patient examined, no change in status, stable for surgery.  I have reviewed the patients' chart and labs.  Questions were answered to the patient's satisfaction.     Yvette Loveless JR,W SPENCER

## 2011-07-24 NOTE — Discharge Instructions (Signed)
Heart Catheterization Home Care ° ° ° °A catheter was placed through the blood vessel in your groin, contrast was injected into the vessels, and pictures were taken. ° ° You may feel some discomfort at the insertion site after the local anesthetic wears off. This discomfort should gradually improve over the next several days. °· Only take over-the-counter or prescription medicines for pain, discomfort, or fever as directed by your caregiver.  °· Complications are very uncommon after this procedure.Call my office if you develop any of the following symptoms:  °· Worsening pain.  °· Bleeding.  °· Severe swelling at the puncture site.  °· Lightheadedness.  °· Dizziness or fainting.  °· Fever or chills.  °· If oozing, bleeding, or a lump appears at the puncture site, apply firm pressure directly to the site steadily for 15 minutes and go to the emergency department.  °· Keep the skin around the insertion site dry. You may take showers after 24 hours. If the area does get wet, dry the skin completely. Avoid baths until the skin puncture site heals, usually 5 to 7 days.  °· Rest  for the remainder of the day and avoid any heavy lifting (more than 10 pounds or 4.5 kg). Do not operate heavy machinery, drive, or make legal decisions for the first 24 hours after the procedure. Have a responsible person drive you home.  °· You may resume your usual diet after the procedure. Avoid alcoholic beverages for 24 hours after the procedure.  ° °

## 2011-07-30 ENCOUNTER — Other Ambulatory Visit: Payer: Self-pay | Admitting: Cardiology

## 2011-07-30 ENCOUNTER — Ambulatory Visit
Admission: RE | Admit: 2011-07-30 | Discharge: 2011-07-30 | Disposition: A | Discharge status: Home / Self Care | Payer: PRIVATE HEALTH INSURANCE | Source: Ambulatory Visit | Attending: Cardiology | Admitting: Cardiology

## 2011-07-30 DIAGNOSIS — R7989 Other specified abnormal findings of blood chemistry: Secondary | ICD-10-CM

## 2011-07-30 MED ORDER — IOHEXOL 350 MG/ML SOLN
100.0000 mL | Freq: Once | INTRAVENOUS | Status: AC | PRN
  Administered 2011-07-30: 100 mL via INTRAVENOUS

## 2011-07-30 NOTE — Progress Notes (Signed)
Allison Valencia    Date of visit:  07/30/2011 DOB:  1955/09/12    Age:  56 yrs. Medical record number:  [id number]     Account number:  [id number] Primary Care Provider: KNAPP, EVE ____________________________ CURRENT DIAGNOSES  1. Shortness of Breath  2. Chest Pain  3. Personal History Of Malignant Neoplasm Of Breast  4. Obesity(BMI30-40)  5. Angina Pectoris ____________________________ ALLERGIES  NKDA ____________________________ MEDICATIONS  1. albuterol sulfate 90 mcg/actuation HFA Aerosol Inhaler, PRN  2. citalopram 40 mg tablet, 1 p.o. daily  3. nitroglycerin 0.4 mg tablet, sublingual, PRN  4. aspirin 325 mg tablet, 1 p.o. daily  5. diltiazem HCl 180 mg tablet extended release 24 hr, 1 p.o. daily ____________________________ CHIEF COMPLAINTS  Followup of Chest Pain ____________________________ HISTORY OF PRESENT ILLNESS  Patient seen for cardiac followup. She had a catheterization done last week that showed some mild calcification of the LAD with scattered irregularities but no significant obstructive stenoses noted. She returns today for followup. She has a mild ecchymoses at the catheterization site. She continues to complain of exertional dyspnea as well as tightness. This has not really improved since the institution of metoprolol. She also had fairly abrupt onset of her symptoms while she was traveling with her husband in [place].  ____________________________ PAST HISTORY  Past Medical Illnesses:  anxiety, fibromyalgia, insomnia, breast cancer treated with surgery, radiation age 71, second occurence treated with surgery, depression, hypertension, obesity;  Cardiovascular Illnesses:  exercise-induced left bundle branch block;  Surgical Procedures:  tonsillectomy, tubal ligation, rt partial mastectomy, breast mastectomy rt, breast mastectomy left, hysterectomy, tram flap, Reconstructive breast surgery;  Cardiology Procedures-Invasive:  cardiac cath (left) June 2013;  Cardiology  Procedures-Noninvasive:  echocardiogram, treadmill June 2013, echocardiogram July 2013;  LVEF of 60% documented via echocardiogram on 07/30/2011 ____________________________ CARDIO-PULMONARY TEST DATES EKG Date:  07/18/2011;   Cardiac Cath Date:  07/24/2011;  Echocardiography Date: 07/30/2011;  Chest Xray Date: 07/15/2011;   ____________________________ SOCIAL HISTORY Alcohol Use:  socially;  Smoking:  never smoked;  Diet:  regular diet;  Lifestyle:  married and 2 children;  Exercise:  no regular exercise;  Occupation:  homemaker;  Residence:  lives with husband;   ____________________________ REVIEW OF SYSTEMS General:  weight gain of approximately 20 lbs  Ears, Nose, Throat, Mouth:  denies any hearing loss, epistaxis, hoarseness or difficulty speaking. Respiratory:  dyspnea with exertion  Cardiovascular:  please review HPI  Abdominal:  denies dyspepsia, GI bleeding, constipation, or diarrhea Musculoskeletal:  fibromyalgia  Psychiatric:  situational stress ____________________________ PHYSICAL EXAMINATION VITAL SIGNS  Blood Pressure:  118/80 Sitting, Left arm, regular cuff  , 114/80 Standing, Left arm and regular cuff   Pulse:  54/min. Weight:  210.00 lbs. Height:  65"BMI: 35  Constitutional:  pleasant white female, in no acute distress, moderately obese Chest:  normal symmetry, clear to auscultation and percussion. Cardiac:  regular rhythm, normal S1 and S2, No S3 or S4, no murmurs, gallops or rubs detected. Extremities & Back:  cath site clean and dry Neurological:  no gross motor or sensory deficits noted, affect appropriate, oriented x3. ____________________________ MOST RECENT LIPID PANEL 07/22/11  CHOL TOTL 182 mg/dl, LDL 098 calc, HDL 41 mg/dl, TRIGLYCER 70 mg/dl and CHOL/HDL 4.5 (Calc) ____________________________ IMPRESSIONS/PLAN  1. Continued dyspnea with exertion in a patient with an exercise-induced left bundle branch block but evidence of only luminal irregularities in  her coronary arteries with mild calcification. At this point in time one would need to exclude pulmonary embolus. I've recommended  that she have a CT scan of her chest to assess for pulmonary emboli. Her echocardiogram today shows normal left ventricular function, normal right ventricular function with an ejection fraction of 60%.  It is possible that her symptoms could be due to to the exercise-induced left bundle branch block or that she could have small vessel or microvascular angina. Since the symptoms have not improved on metoprolol, I recommended that she discontinue this and change to extended release diltiazem. I would like her to have a CT scan to exclude pulmonary embolus. Return visit in one month. If her symptoms persist she may need to have either a cardiopulmonary test for a referral to a pulmonologist.  We also discussed importance of regular exercise as well as weight loss.  ____________________________ TODAYS ORDERS  1. CT Chest w/ Contrast: First Available  2. Return Visit: 1 month                       ____________________________ Cardiology Physician:  Darden Palmer MD [facility]

## 2011-08-07 ENCOUNTER — Ambulatory Visit: Payer: PRIVATE HEALTH INSURANCE | Admitting: Family Medicine

## 2011-08-28 ENCOUNTER — Other Ambulatory Visit: Payer: Self-pay | Admitting: Family Medicine

## 2011-08-28 NOTE — Telephone Encounter (Signed)
Called pharmacy they were confused but called pt and she said the 90 day is what she has gotten and still wants called in so that is what I done xanax  1 mg # 135 0 refills

## 2011-08-28 NOTE — Telephone Encounter (Signed)
Is this okay?

## 2011-08-28 NOTE — Telephone Encounter (Signed)
Last filled #135 on 4/3, as pt had been requesting 90 day supply.  Looks like pharmacy is requesting 45 tablet on this refill.  Please check with pharm/pt why changing quantity--is she unable to get 90 day supply through [facility]? Okay for #45 with 2 refills, or #135 with no refill, whichever insurance covers/patient prefers.

## 2011-12-12 ENCOUNTER — Telehealth: Payer: Self-pay | Admitting: Family Medicine

## 2011-12-12 MED ORDER — ALPRAZOLAM 1 MG PO TABS: 1.0000 mg | ORAL_TABLET | Freq: Two times a day (BID) | ORAL | Status: DC | PRN

## 2011-12-12 NOTE — Telephone Encounter (Signed)
Spoke with pt who is with her daughter in [place].  Her husband (who is an alcoholic) is in jail, for threatening her younger daughter (with bipolar disorder, who lives with them) with a knife.  Daughter was kicked out of the home.  She will be staying in [place] for a few more days.  We last refilled 135 3 months ago.  She reports that although we call it in that way, she only picks up 30 at a time.  I called in #135 (and she will pick up 30, and get remainder when needed back in [place]).

## 2012-03-03 ENCOUNTER — Telehealth: Payer: Self-pay | Admitting: *Deleted

## 2012-03-03 ENCOUNTER — Other Ambulatory Visit: Payer: Self-pay | Admitting: Family Medicine

## 2012-03-03 NOTE — Telephone Encounter (Signed)
Is this okay to refill? 

## 2012-03-03 NOTE — Telephone Encounter (Signed)
Okay to refill until med check.  Please set up med check

## 2012-03-03 NOTE — Telephone Encounter (Signed)
Called patient and left message letting her know that celexa was refilled x 1 month and asking her to call and schedule appt for med check tomorrow.

## 2012-03-04 ENCOUNTER — Telehealth: Payer: Self-pay | Admitting: Internal Medicine

## 2012-03-04 NOTE — Telephone Encounter (Signed)
Wasn't this done yest?

## 2012-03-04 NOTE — Telephone Encounter (Signed)
Yes, I called in 30 days worth and left a detailed message on her voicemail.

## 2012-03-25 ENCOUNTER — Telehealth: Payer: Self-pay | Admitting: Internal Medicine

## 2012-03-25 MED ORDER — CITALOPRAM HYDROBROMIDE 40 MG PO TABS: 40.0000 mg | ORAL_TABLET | Freq: Every day | ORAL | Status: DC

## 2012-03-25 NOTE — Telephone Encounter (Signed)
Spoke with patient and got med check scheduled for 06/30/12 @ 1:45pm- this was the first time she was available to come in. Also called in rx for 90 days worth of celexa 40mg .

## 2012-03-25 NOTE — Telephone Encounter (Signed)
Still needs med check set up. (see previous message, earlier this month). Okay for 40mg  to last until med check

## 2012-03-31 ENCOUNTER — Ambulatory Visit: Payer: PRIVATE HEALTH INSURANCE | Admitting: Family Medicine

## 2012-04-01 ENCOUNTER — Encounter: Payer: Self-pay | Admitting: Cardiology

## 2012-04-01 NOTE — Progress Notes (Signed)
Patient ID: Allison Valencia, female   DOB: 1955-12-27, 57 y.o.   MRN: [id number]  Areliz, Rothman    Date of visit:  04/01/2012 DOB:  [birth year]    Age:  57 yrs. Medical record number:  [id number]     Account number:  [id number] Primary Care Provider: KNAPP, EVE ____________________________ CURRENT DIAGNOSES  1. Shortness of Breath  2. Chest Pain  3. Personal History Of Malignant Neoplasm Of Breast  4. Obesity(BMI30-40)  5. Angina Pectoris ____________________________ ALLERGIES  NKDA ____________________________ MEDICATIONS  1. citalopram 40 mg tablet, 1 p.o. daily  2. nitroglycerin 0.4 mg tablet, sublingual, PRN  3. alprazolam 0.5 mg tablet, 1/2 qhs  4. Qsymia 3.75-23 mg capsule, ER multiphase 24 hr, 1 p.o. daily  5. verapamil 180 mg tablet extended release, 1 p.o. daily ____________________________ CHIEF COMPLAINTS  Thinks diltiazem is causing head congestion ____________________________ HISTORY OF PRESENT ILLNESS  Patient returns for cardiac followup. She comes in today because she has been having persistent nasal congestion accompanied with allergy as well as sores in her nose for over 6 months. She notes the onset of this to when she is taking diltiazem and wondered if the diltiazem was causing this as she says it is listed as a side effect. She has been going to a weight loss clinic and has been taking anorexic agents. She has lost 23 pounds since she was here. She stopped taking diltiazem when she ran out of it while visiting her mother and did note some return of the dyspnea that she had previously and went back on it.  She has no PND, orthopnea or edema. ____________________________ PAST HISTORY  Past Medical Illnesses:  anxiety, fibromyalgia, insomnia, breast cancer treated with surgery, radiation age 51, second occurence treated with surgery, depression, hypertension, obesity;  Cardiovascular Illnesses:  exercise-induced left bundle branch block;  Surgical Procedures:   tonsillectomy, tubal ligation, rt partial mastectomy, breast mastectomy rt, breast mastectomy left, hysterectomy, tram flap, Reconstructive breast surgery;  Cardiology Procedures-Invasive:  cardiac cath (left) June 2013;  Cardiology Procedures-Noninvasive:  echocardiogram, treadmill June 2013, echocardiogram July 2013;  LVEF of 60% documented via echocardiogram on 07/30/2011 ____________________________ CARDIO-PULMONARY TEST DATES EKG Date:  07/18/2011;   Cardiac Cath Date:  07/24/2011;  Echocardiography Date: 07/30/2011;  Chest Xray Date: 07/15/2011;  CT Scan Date:  07/30/2011   ____________________________ SOCIAL HISTORY Alcohol Use:  socially;  Smoking:  never smoked;  Diet:  regular diet;  Lifestyle:  married and 2 children;  Exercise:  no regular exercise;  Occupation:  homemaker;  Residence:  lives with husband;   ____________________________ REVIEW OF SYSTEMS General:  weight loss of approximately 25 lbs  Ears, Nose, Throat, Mouth:  nasal congestion  Respiratory:  mild dyspnea with exertion  Cardiovascular:  please review HPI Abdominal:  denies dyspepsia, GI bleeding, constipation, or diarrhea  Musculoskeletal:  fibromyalgia Psychiatric:  situational stress ____________________________ PHYSICAL EXAMINATION VITAL SIGNS  Blood Pressure:  118/80 Sitting, Left arm, large cuff  , 118/74 Standing, Left arm and large cuff   Pulse:  76/min. Weight:  197.00 lbs. Height:  65"BMI: 33  Constitutional:  pleasant white female, in no acute distress, moderately obese ENT:  nasal congestion Neck:  neck supple without masses. No JVD, thyromegaly or bruits Chest:  normal symmetry, clear to auscultation and percussion. Cardiac:  regular rhythm, normal S1 and S2, No S3 or S4, no murmurs, gallops or rubs detected. Extremities & Back:  no deformities, clubbing, cyanosis, erythema or edema observed. Normal muscle strength and tone. Neurological:  no gross motor or sensory deficits noted, affect appropriate,  oriented x3. ____________________________ MOST RECENT LIPID PANEL 07/22/11  CHOL TOTL 182 mg/dl, LDL 161 calc, HDL 41 mg/dl, TRIGLYCER 70 mg/dl and CHOL/HDL 4.5 (Calc) ____________________________ IMPRESSIONS/PLAN  1. Nasal congestion-unclear but is due to diltiazem or not 2. Obesity but with weight loss since here 3. Clinical angina and dyspnea with no significant coronary artery disease  Recommendations:  I think she can change to a sustained release verapamil 180 mg daily. I encouraged her to get an allergy consultation if her symptoms don't go away with medication change. Followup in 6 weeks. ____________________________ TODAYS ORDERS  1. Return Visit: 6 weeks                       ____________________________ Cardiology Physician:  Darden Palmer MD [facility]

## 2012-04-09 ENCOUNTER — Telehealth: Payer: Self-pay | Admitting: Internal Medicine

## 2012-04-09 NOTE — Telephone Encounter (Signed)
Faxed over medical record to [facility] stating we do not have any records of CT chest,CPE, labs or hx of breast related info as she has been seen here

## 2012-04-14 ENCOUNTER — Other Ambulatory Visit: Payer: Self-pay | Admitting: Family Medicine

## 2012-04-14 NOTE — Telephone Encounter (Signed)
Ok to refill as requested 

## 2012-04-14 NOTE — Telephone Encounter (Signed)
Is this okay to refill? 

## 2012-05-17 ENCOUNTER — Encounter: Payer: Self-pay | Admitting: Cardiology

## 2012-05-17 NOTE — Progress Notes (Signed)
Patient ID: EDIT RICCIARDELLI, female   DOB: 08-13-1955, 57 y.o.   MRN: [id number]  Skyeler, Scalese    Date of visit:  05/17/2012 DOB:  [birth year]    Age:  56 yrs. Medical record number:  [id number]     Account number:  [id number] Primary Care Provider: KNAPP, EVE ____________________________ CURRENT DIAGNOSES  1. Obesity(BMI30-40)  2. Left Bundle Branch Block  3. Personal History Of Malignant Neoplasm Of Breast ____________________________ ALLERGIES  Anectine ____________________________ MEDICATIONS  1. citalopram 40 mg tablet, 1 p.o. daily  2. nitroglycerin 0.4 mg tablet, sublingual, PRN  3. alprazolam 0.5 mg tablet, 1/2 qhs  4. verapamil 180 mg tablet extended release, 1 p.o. daily  5. phentermine 37.5 mg tablet, 1 p.o. daily ____________________________ HISTORY OF PRESENT ILLNESS  Patient seen for cardiac followup. Since she was previously here she has been changed to phenteramine and has lost an additional 5 pounds. She stopped taking diltiazem and also saw the ENT doctor notes that her sinus problems oral better now. She is tolerating verapamil well and is able to exercise and really doesn't have angina or shortness of breath. She denies PND, orthopnea, syncope, or claudication. ____________________________ PAST HISTORY  Past Medical Illnesses:  anxiety, fibromyalgia, insomnia, breast cancer treated with surgery, radiation age 4, second occurence treated with surgery, depression, hypertension, obesity;  Cardiovascular Illnesses:  exercise-induced left bundle branch block;  Surgical Procedures:  tonsillectomy, tubal ligation, rt partial mastectomy, breast mastectomy rt, breast mastectomy left, hysterectomy, tram flap, Reconstructive breast surgery;  Cardiology Procedures-Invasive:  cardiac cath (left) June 2013;  Cardiology Procedures-Noninvasive:  echocardiogram, treadmill June 2013, echocardiogram July 2013;  LVEF of 60% documented via echocardiogram on 07/30/2011,    ____________________________ CARDIO-PULMONARY TEST DATES EKG Date:  07/18/2011;   Cardiac Cath Date:  07/24/2011;  Echocardiography Date: 07/30/2011;  Chest Xray Date: 07/15/2011;  CT Scan Date:  07/30/2011   ____________________________ SOCIAL HISTORY Alcohol Use:  socially;  Smoking:  never smoked;  Diet:  regular diet;  Lifestyle:  married and 2 children;  Exercise:  no regular exercise;  Occupation:  homemaker;  Residence:  lives with husband;   ____________________________ REVIEW OF SYSTEMS General:  weight loss of approximately 5 lbs Respiratory: mild dyspnea with exertion Cardiovascular:  please review HPI Abdominal: denies dyspepsia, GI bleeding, constipation, or diarrhea Musculoskeletal:  fibromyalgia  ____________________________ PHYSICAL EXAMINATION VITAL SIGNS  Blood Pressure:  108/60 Sitting, Right arm, large cuff  , 104/54 Standing, Right arm and regular cuff   Pulse:  68/min. Weight:  192.00 lbs. Height:  65"BMI: 32  Constitutional:  pleasant white female, in no acute distress, moderately obese Neck:  neck supple without masses. No JVD, thyromegaly or bruits Chest:  normal symmetry, clear to auscultation and percussion. Cardiac:  regular rhythm, normal S1 and S2, No S3 or S4, no murmurs, gallops or rubs detected. Extremities & Back:  no deformities, clubbing, cyanosis, erythema or edema observed. Normal muscle strength and tone. Neurological:  no gross motor or sensory deficits noted, affect appropriate, oriented x3. ____________________________ MOST RECENT LIPID PANEL 07/22/11  CHOL TOTL 182 mg/dl, LDL 829 calc, HDL 41 mg/dl, TRIGLYCER 70 mg/dl and CHOL/HDL 4.5 (Calc) ____________________________ IMPRESSIONS/PLAN  1. Exercise induced left bundle branch block with normal coronary arteries 2. Obesity with need to lose additional weight 3. Addition of phenteramine that has been associated with interaction with SSRI  Recommendations:  She is clinically feeling a whole  lot better. I will see her in 6 months. I asked her to discuss with her prescribing  physician about the drug interaction. ____________________________ TODAYS ORDERS  1. Return Visit: 6 months                       ____________________________ Cardiology Physician:  Darden Palmer MD [facility]

## 2012-06-24 ENCOUNTER — Other Ambulatory Visit: Payer: Self-pay | Admitting: Family Medicine

## 2012-06-24 NOTE — Telephone Encounter (Signed)
Is this okay?

## 2012-06-24 NOTE — Telephone Encounter (Signed)
She hasn't been seen in a year.  I see appt scheduled for next week--I can't tell what it is for (med check, PE, or acute visit).  Check with pt to see if she has enough to last to visit, if not, just give 30.  If yes, plan to refill at visit (for 90)

## 2012-06-24 NOTE — Telephone Encounter (Signed)
Pt has enough meds to get her to her appt next week. Dr. Lynelle Doctor will refill med then

## 2012-06-28 ENCOUNTER — Telehealth: Payer: Self-pay | Admitting: Internal Medicine

## 2012-06-28 DIAGNOSIS — F411 Generalized anxiety disorder: Secondary | ICD-10-CM

## 2012-06-28 MED ORDER — CITALOPRAM HYDROBROMIDE 40 MG PO TABS: 40.0000 mg | ORAL_TABLET | Freq: Every day | ORAL | Status: DC

## 2012-06-28 NOTE — Telephone Encounter (Signed)
Done, pt has med check scheduled for this Wed 06/30/12.

## 2012-06-28 NOTE — Telephone Encounter (Signed)
Refill request for citalopram 40mg  #90 to [facility]

## 2012-06-30 ENCOUNTER — Encounter: Payer: Self-pay | Admitting: Family Medicine

## 2012-06-30 ENCOUNTER — Ambulatory Visit (INDEPENDENT_AMBULATORY_CARE_PROVIDER_SITE_OTHER): Payer: BC Managed Care – PPO | Admitting: Family Medicine

## 2012-06-30 VITALS — BP 122/72 | HR 76 | Ht 64.0 in | Wt 196.0 lb

## 2012-06-30 DIAGNOSIS — IMO0001 Reserved for inherently not codable concepts without codable children: Secondary | ICD-10-CM

## 2012-06-30 DIAGNOSIS — R5383 Other fatigue: Secondary | ICD-10-CM

## 2012-06-30 DIAGNOSIS — M797 Fibromyalgia: Secondary | ICD-10-CM

## 2012-06-30 DIAGNOSIS — G4700 Insomnia, unspecified: Secondary | ICD-10-CM

## 2012-06-30 DIAGNOSIS — F411 Generalized anxiety disorder: Secondary | ICD-10-CM

## 2012-06-30 DIAGNOSIS — E669 Obesity, unspecified: Secondary | ICD-10-CM

## 2012-06-30 DIAGNOSIS — R5381 Other malaise: Secondary | ICD-10-CM

## 2012-06-30 DIAGNOSIS — I447 Left bundle-branch block, unspecified: Secondary | ICD-10-CM

## 2012-06-30 LAB — COMPREHENSIVE METABOLIC PANEL
ALT: 18 U/L (ref 0–35)
AST: 22 U/L (ref 0–37)
Alkaline Phosphatase: 79 U/L (ref 39–117)
Calcium: 9 mg/dL (ref 8.4–10.5)
Chloride: 102 mEq/L (ref 96–112)
Creat: 1.01 mg/dL (ref 0.50–1.10)
Potassium: 4.7 mEq/L (ref 3.5–5.3)

## 2012-06-30 LAB — CBC WITH DIFFERENTIAL/PLATELET
Basophils Absolute: 0 10*3/uL (ref 0.0–0.1)
Basophils Relative: 0 % (ref 0–1)
MCHC: 33.2 g/dL (ref 30.0–36.0)
Neutro Abs: 4.3 10*3/uL (ref 1.7–7.7)
Neutrophils Relative %: 64 % (ref 43–77)
RDW: 14.4 % (ref 11.5–15.5)
WBC: 6.8 10*3/uL (ref 4.0–10.5)

## 2012-06-30 MED ORDER — ZOLPIDEM TARTRATE ER 12.5 MG PO TBCR: 12.5000 mg | EXTENDED_RELEASE_TABLET | Freq: Every evening | ORAL | Status: DC | PRN

## 2012-06-30 MED ORDER — ALPRAZOLAM 1 MG PO TABS: ORAL_TABLET | ORAL | Status: DC

## 2012-06-30 NOTE — Patient Instructions (Addendum)
I'd like for you to try the Ambien CR nightly for sleep, rather than using xanax daily.  You need to taper down, and not stop immediately.  Use 1/2 tablet for a week, then 1/4 (can use simply sleep with it).  And then can switch to the Ambien CR.  If this is not effective for helping you sleep, can then go back to the alprazolam, and let me know when you need another rx, to increase the quantity again.    It is recommended that you get at least 30 minutes of aerobic exercise at least 5 days/week (for weight loss, you may need as much as 60-90 minutes). This can be any activity that gets your heart rate up. This can be divided in 10-15 minute intervals if needed, but try and build up your endurance at least once a week.  Weight bearing exercise is also recommended twice weekly.

## 2012-06-30 NOTE — Progress Notes (Signed)
Chief Complaint  Patient presents with  . Anxiety    med check.   Patient presents for follow up on anxiety, needing med refills, with complaint o fatigue.  Bipolar daughter now lives in the [place].  "out of sight, out of mind", less stress overall--costing her less. Still worries about her, especially in that she doesn't have medical insurance.  Husband is back in [place], doing well. Takes 1 xanax every night to sleep.  Can't sleep without it.  Lunesta gave her nighmares.  Valerian root didn't work.  Simply Sleep used to help more in the past than now, but she still occasionally uses it. She also uses the xanax prn for anxiety, often times when she is talking to her daughter over the phone.  Not crying, but lacking motivaton (did plant flowers this year for the first time in 4 years).  Feeling tired, not wanting to do anything. No longer sewing, unable to get started on sewing projects.  Seeing weight loss doctor at [facility] in [facility].  She was put on weight loss meds.  Lost 20# in 2 months (the weight gained since her heart issue), then losing 4-5 pounds since then.  Qsymia didn't work for her at all.  She was then put on phentermine, but Dr. Donnie Aho mentioned poss interaction with citalopram, so she never filled it.  Hasn't been back to see her for 2 months. Unmotivated now, but had been keeping food journals, exercising, etc until the last couple of months; lots of travel in last month.  Sees Dr.Tilley.  Had exercise-induced LBBB, no major blockage on cath.  She didn't tolerate metoprolol due to sinus congestion, has been doing much better since switching to verapamil.  "I don't have energy", "I'm lazy".  She bought [employer] yesterday.  She is now able to exercise, walk on treadmil at 4.0 for 30 minutes on elliptical.  She worked out regularly when she had free membership at [employer] for 4 months, now trying to decide which gym to join.  Past  Medical History  Diagnosis Date  . Breast cancer age 80, recurrent 2010  . Allergy   . Anxiety   . Depression   . OSA (obstructive sleep apnea)     sleep study 6/09 (not on treatment; pt denies OSA)  . Fibromyalgia 2/07  . Hypertension 2006  . Viral meningitis 5/99  . Tricuspid regurgitation 6/02    mild  . Breast cancer    Past Surgical History  Procedure Laterality Date  . Breast surgery      extended partial R mastectomy with LND age 60; bilateral mastectomy 11/2008, multiple reconstructive surgeries 2011-2012  . Abdominal hysterectomy  03/2009    and BSO; Dr. Stefano Gaul  . Mastectomy    . Vesicovaginal fistula closure w/ tah     History   Social History  . Marital Status: Married    Spouse Name: N/A    Number of Children: N/A  . Years of Education: N/A   Occupational History  . Not on file.   Social History Main Topics  . Smoking status: Never Smoker   . Smokeless tobacco: Never Used  . Alcohol Use: Yes     Comment: 1-2 drinks once or twice a month.  . Drug Use: No  . Sexually Active: Not on file   Other Topics Concern  . Not on file   Social History Narrative   Lives with husband,  2 daughters--1  in in [place] with 2 grandkids, and 1 in [place].  Husband is an alcoholic, in AA    Current Outpatient Prescriptions on File Prior to Visit  Medication Sig Dispense Refill  . ALPRAZolam (XANAX) 1 MG tablet take 1 tablet by mouth twice a day if needed for anxiety  105 tablet  0  . citalopram (CELEXA) 40 MG tablet Take 1 tablet (40 mg total) by mouth daily.  90 tablet  0  . nitroGLYCERIN (NITROSTAT) 0.4 MG SL tablet Place 0.4 mg under the tongue every 5 (five) minutes as needed. For chest pain       No current facility-administered medications on file prior to visit.   Allergies  Allergen Reactions  . Anectine (Succinylcholine Chloride)     "FAMILY ALLERGY"  . Shellfish Allergy Other (See Comments)    Unknown...dr as child said to never eat   ROS:   Denies fevers; weight changes as per HPI.  Denies URI symptoms, allergies, cough, shortness of breath, chest pain, edema.  Denies skin rashes, bleeding/bruising, or other concerns.  +insomnia.  PHYSICAL EXAM: BP 122/72  Pulse 76  Ht 5\' 4"  (1.626 m)  Wt 196 lb (88.905 kg)  BMI 33.63 kg/m2 Well developed pleasant obese female in no distress Neck: no lymphadenopathy, thyroemegaly or carotid bruit HEENT:  PERRL, EOMI, conjnctiva clear Heart: regular rate and rhythm without murmur Lungs: clear bilaterally Extremities: no edema, 2+ pulse Abdomen: soft, nontender, no mass Psych: normal mood, affect, hygiene and grooming. Doesn't appear anxious, perhaps slightly tired.  ASSESSMENT/PLAN:  Anxiety state, unspecified - Plan: ALPRAZolam (XANAX) 1 MG tablet  Insomnia - Plan: zolpidem (AMBIEN CR) 12.5 MG CR tablet  Other malaise and fatigue - Plan: Comprehensive metabolic panel, CBC with Differential, Vitamin D 25 hydroxy, TSH  Fibromyalgia  Exercise induced LBBB  Obesity (BMI 30-39.9)  Discussed using xanax prn anxiety, not nightly for sleep.  Discussed why.  Taper down, then try Ambien CR 12.5 mg nightly instead.  Risks and side effects reviewed. If med isn't tolerated, can go back to xanax. Continue citalopram daily.  Fatigue--discussed workup and Ddx.  Her energy will likely improve if she can get back into daily routine of exercise.  Check labs.  Fibromyalgia--overall doing well.  Some achiness related to recent gardening.  Exercise-induced LBBB--stable.    30 minute visit, more than 1/2 spent counseling. Encouraged counseling prn.

## 2012-07-01 ENCOUNTER — Encounter: Payer: Self-pay | Admitting: Family Medicine

## 2012-07-01 LAB — TSH: TSH: 2.384 u[IU]/mL (ref 0.350–4.500)

## 2012-07-01 LAB — VITAMIN D 25 HYDROXY (VIT D DEFICIENCY, FRACTURES): Vit D, 25-Hydroxy: 48 ng/mL (ref 30–89)

## 2012-07-26 ENCOUNTER — Encounter: Payer: Self-pay | Admitting: Family Medicine

## 2012-07-26 ENCOUNTER — Ambulatory Visit (INDEPENDENT_AMBULATORY_CARE_PROVIDER_SITE_OTHER): Payer: BC Managed Care – PPO | Admitting: Family Medicine

## 2012-07-26 VITALS — BP 128/82 | HR 76 | Temp 97.9°F | Ht 64.0 in | Wt 193.0 lb

## 2012-07-26 DIAGNOSIS — J029 Acute pharyngitis, unspecified: Secondary | ICD-10-CM

## 2012-07-26 DIAGNOSIS — J020 Streptococcal pharyngitis: Secondary | ICD-10-CM

## 2012-07-26 NOTE — Progress Notes (Signed)
Chief Complaint  Patient presents with  . Sore Throat    thinks 31mo granddaughter just recently had hand foot and mouth. Throat is really hurting her. Also took Ambien CR 5 nights and never really fell asleep, eyes were closed but she could here everything-some strange dreams. Did strep test.   Having some postnasal drainage down back of throat.  Her tongue had sores on the tip, lost sense of taste, had some sores in the back of her throat, and also under her tongue.  She had some pain in her glands in her neck.  Sores are improved, but she continues to have a sore throat--she is wondering if sores have moved down her throat.  Throat feels dry, and has a cough that is worse at the end of the day.  Denies fevers, but felt warm yesterday.  Denies rash.  Granddaughter had hand-foot-mouth, and is now better.  They have shared food/drinks.  She denies runny nose, sinus pain.  She has done some salt water rinses (not gargling--makes her gag), no other treatments.  She tried eating honey, to coat her throat, as she feels like she may have sores going down her throat.   Insomnia--  She feels like she never really fell asleep with the Ambien CR--could still hear things, never felt like she slept.  Back to taking xanax to help her sleep.  Past Medical History  Diagnosis Date  . Breast cancer age 68, recurrent 2010  . Allergy   . Anxiety   . Depression   . OSA (obstructive sleep apnea)     sleep study 6/09 (not on treatment; pt denies OSA)  . Fibromyalgia 2/07  . Hypertension 2006  . Viral meningitis 5/99  . Tricuspid regurgitation 6/02    mild  . Breast cancer    Past Surgical History  Procedure Laterality Date  . Breast surgery      extended partial R mastectomy with LND age 39; bilateral mastectomy 11/2008, multiple reconstructive surgeries 2011-2012  . Abdominal hysterectomy  03/2009    and BSO; Dr. Stefano Gaul  . Mastectomy    . Vesicovaginal fistula closure w/ tah     History   Social  History  . Marital Status: Married    Spouse Name: N/A    Number of Children: N/A  . Years of Education: N/A   Occupational History  . Not on file.   Social History Main Topics  . Smoking status: Never Smoker   . Smokeless tobacco: Never Used  . Alcohol Use: Yes     Comment: 1-2 drinks once or twice a month.  . Drug Use: No  . Sexually Active: Not on file   Other Topics Concern  . Not on file   Social History Narrative   Lives with husband,  2 daughters--1 in in [place] with 2 grandkids, and 1 in [place].  Husband is an alcoholic, in AA   Current Outpatient Prescriptions on File Prior to Visit  Medication Sig Dispense Refill  . ALPRAZolam (XANAX) 1 MG tablet take 1 tablet by mouth twice a day if needed for anxiety  30 tablet  1  . citalopram (CELEXA) 40 MG tablet Take 1 tablet (40 mg total) by mouth daily.  90 tablet  0  . verapamil (CALAN-SR) 180 MG CR tablet Take 180 mg by mouth at bedtime.      . nitroGLYCERIN (NITROSTAT) 0.4 MG SL tablet Place 0.4 mg under the tongue every 5 (five) minutes as needed. For chest  pain       No current facility-administered medications on file prior to visit.   Zolpidem 10mg  prn  Allergies  Allergen Reactions  . Anectine (Succinylcholine Chloride)     "FAMILY ALLERGY"  . Shellfish Allergy Other (See Comments)    Unknown...dr as child said to never eat   ROS:  Denies fever, nausea, vomiting, diarrhea, chest pain, palpitations, shortness of breath, bleeding/bruising, skin rash, dysphagia.  +insomnia, adequately treated by xanax at bedtime.  Ambien was ineffective.    PHYSICAL EXAM: BP 128/82  Pulse 76  Temp(Src) 97.9 F (36.6 C) (Oral)  Ht 5\' 4"  (1.626 m)  Wt 193 lb (87.544 kg)  BMI 33.11 kg/m2 Pleasant female in no distress.  Frequent throat-clearing. HEENT:  PERRL, EOMi ,conjunctiva clear.  TM's and EAC's normal.  Nasal mucosa mild-moderately edematous, no erythema or purulence.  Sinuses nontender.  OP with erythema  posteriorly, otherwise normal.  No lesions/ulcers noted Neck: no lymphadenopathy Heart: regular rate and rhythm without murmur Lungs: clear bilaterally Extremities: no edema Psych: normal mood, affect, hygiene and grooming  Rapid strep negative  ASSESSMENT/PLAN:  Acute pharyngitis  Streptococcal sore throat - Plan: Rapid Strep A (NOT STREP pharyngitis--ruled out.  Pharyngitis--likely viral given history of recent sick contact.  Given that she is having postnasal drainage, recommend use of claritin or zyrtec to help dry up the drainage.  If gets thicker or when has cough, use guaifenesin.  Tylenol or ibuprofen as needed for pain.  All questions were answered.

## 2012-07-26 NOTE — Patient Instructions (Addendum)
Try claritin or zyrtec to help dry up the drainage down the back of the throat--which is causing soreness and cough.  If gets thicker or when you are coughing, use guaifenesin +/- dextromethorphan (ie Mucinex-DM or Robitussin DM).  Tylenol or ibuprofen as needed for pain.

## 2012-07-28 ENCOUNTER — Telehealth: Payer: Self-pay | Admitting: Family Medicine

## 2012-07-28 DIAGNOSIS — F411 Generalized anxiety disorder: Secondary | ICD-10-CM

## 2012-07-28 MED ORDER — ALPRAZOLAM 1 MG PO TABS: ORAL_TABLET | ORAL | Status: DC

## 2012-07-28 MED ORDER — AZITHROMYCIN 250 MG PO TABS: ORAL_TABLET | ORAL | Status: DC

## 2012-07-28 NOTE — Telephone Encounter (Signed)
Spoke with patient and she stated that she has had the sores in her mouth for over a week and a half. She has a low grade temp, blowing lots of green mucus and this is moving into her chest x 2 days. I explained that you want her to wait 3-5 days and she should improve on her own. She said she is sorry to be a pain but she really just wants to feel better. She is babysitting her grandkids this Sunday and wants to be better and also not be contagious. She is already using mucinex. As far as the xanax she did not call in for the refill. She usually gets #90 and this refill is for #30-states that her copay would be the same so she was wanting to know if you could call in the #90, if not she will call in for the #30 refill, just wanted me to ask you. Please advise. Thanks.

## 2012-07-28 NOTE — Telephone Encounter (Signed)
She can have #90 of xanax, but pharmacy needs to be called to have the refill of #30 CANCELLED. If she truly has a viral infection, as I suspect (given to her by her grandchildren), then she will feel better in a week whether she takes ABX or not.  If she takes ABX now, then she likely will develop resistance so in the future they may not work as well.  Since it is a holiday weekend, okay to give rx, however, I strongly encourage that she wait at least 2-3 more days to see if symptoms start improving without ABX first.  If she starts them, she must complete the course.  Okay for z-pak

## 2012-07-28 NOTE — Telephone Encounter (Signed)
This is likely still all viral.  Only recommend starting antibiotics if symptoms are persistent/worsening after a week of symptoms (always feels bad, with discolored mucus, for the first 3-5 days of a viral illness, then should improve on its own).  Use mucinex to thin out any thick secretions, sinus rinses or neti-pot as needed. Xanax--she was given 30 with 1 refill on 6/4.  Did she already fill and use that refill?

## 2012-07-28 NOTE — Telephone Encounter (Signed)
Left message for patient to return my call.

## 2012-07-28 NOTE — Telephone Encounter (Signed)
Phoned in zpak and xanax 1mg  #90, also canceled refill on the #30 from last time. Patient advised of Dr.Knapp's recommendations.

## 2012-07-30 ENCOUNTER — Other Ambulatory Visit: Payer: Self-pay | Admitting: Family Medicine

## 2012-07-30 MED ORDER — ERYTHROMYCIN 5 MG/GM OP OINT: TOPICAL_OINTMENT | Freq: Two times a day (BID) | OPHTHALMIC | Status: DC

## 2012-07-30 NOTE — Progress Notes (Signed)
Woke up with green drainage in eye. Romycin will be given

## 2012-08-12 ENCOUNTER — Telehealth: Payer: Self-pay | Admitting: Internal Medicine

## 2012-08-12 NOTE — Telephone Encounter (Signed)
Pt states she still has a sore throat  And she is not any better. Pt states her daughter had hand foot and mouth too and her doctor gave her predisone and it cleared it up iin a day so she wasn't sure if that would clear hers up.  She states Dr. Susann Givens gave her pink eye med the other day and it cleared up and now she has pink eye again and will use what is left of that med for that. Send to [facility]

## 2012-08-12 NOTE — Telephone Encounter (Signed)
She was seen 6/30, felt to have virus.  Hand-foot-mouth is viral, and should self-resolve.  If at this point she is having significant ongoing sore throat, needs re-eval.  The pinkeyes are also viral, but okay to continue to use the drops as needed, we can give refill if she doesn't have enough for this course (likely should have enough left over from her recent rx).

## 2012-08-12 NOTE — Telephone Encounter (Signed)
Patient informed of Dr. Delford Field response. She stated that she does have enough eye drops at the present time and will call for OV if she decides to be re-evaluated.

## 2012-10-08 ENCOUNTER — Other Ambulatory Visit: Payer: Self-pay | Admitting: Family Medicine

## 2012-10-08 DIAGNOSIS — F411 Generalized anxiety disorder: Secondary | ICD-10-CM

## 2012-10-08 NOTE — Telephone Encounter (Signed)
Her last visit in June said to continue; f/u not due until December.  I sent in refill

## 2012-10-08 NOTE — Telephone Encounter (Signed)
Is this okay to refill? 

## 2012-10-13 ENCOUNTER — Telehealth: Payer: Self-pay | Admitting: Internal Medicine

## 2012-10-13 NOTE — Telephone Encounter (Signed)
Faxed over medical records to [facility]

## 2012-10-15 ENCOUNTER — Other Ambulatory Visit: Payer: Self-pay | Admitting: Family Medicine

## 2012-10-15 NOTE — Telephone Encounter (Signed)
Ok for #90 with no refill Chart reviewed (using nightly, plus prn. Tried Ambien CR but didn't seem to be effective, so back to xanax since June; last refill 7/2)

## 2012-10-15 NOTE — Telephone Encounter (Signed)
IS THIS OK 

## 2012-12-31 ENCOUNTER — Other Ambulatory Visit: Payer: Self-pay | Admitting: Family Medicine

## 2013-01-03 NOTE — Telephone Encounter (Signed)
Ok to refill, but she needs to schedule a med check

## 2013-01-03 NOTE — Telephone Encounter (Signed)
Is this okay to fill? 

## 2013-01-13 ENCOUNTER — Other Ambulatory Visit: Payer: Self-pay | Admitting: Family Medicine

## 2013-02-03 ENCOUNTER — Encounter: Payer: Self-pay | Admitting: Family Medicine

## 2013-02-15 ENCOUNTER — Encounter: Payer: Self-pay | Admitting: Medical

## 2013-02-15 ENCOUNTER — Ambulatory Visit (INDEPENDENT_AMBULATORY_CARE_PROVIDER_SITE_OTHER): Payer: BC Managed Care – PPO | Admitting: Medical

## 2013-02-15 VITALS — BP 118/78 | HR 64 | Temp 98.6°F | Resp 16 | Wt 210.0 lb

## 2013-02-15 DIAGNOSIS — J209 Acute bronchitis, unspecified: Secondary | ICD-10-CM

## 2013-02-15 DIAGNOSIS — J329 Chronic sinusitis, unspecified: Secondary | ICD-10-CM

## 2013-02-15 MED ORDER — METHYLPREDNISOLONE ACETATE 40 MG/ML IJ SUSP
40.0000 mg | Freq: Once | INTRAMUSCULAR | Status: AC
  Administered 2013-02-15: 40 mg via INTRAMUSCULAR

## 2013-02-15 MED ORDER — AMOXICILLIN-POT CLAVULANATE 875-125 MG PO TABS: 1.0000 | ORAL_TABLET | Freq: Two times a day (BID) | ORAL | Status: DC

## 2013-02-15 NOTE — Addendum Note (Signed)
Addended by: Pernell Dupre A on: 02/15/2013 04:08 PM   Modules accepted: Orders

## 2013-02-15 NOTE — Progress Notes (Signed)
Subjective:  Allison Valencia is a 58 y.o. female who presents for possible sinus infection.  Symptoms include several days of sinus pressure, sinus drainage down back of throat, sneezing, cough, congestion, green sputum, feels a little SOB, teeth pain, ear pressure, sore throat from drainage. Denies fever, no NVD.  Past history is significant for sinus infections, occasional bronchitis. Patient is a non-smoker.  Using allergy pill and nasal saline and hot tea for symptoms.  +several sick contacts.  Around thanksgiving got sick, lost voice for a month. No other aggravating or relieving factors.  No other c/o.  ROS as in subjective   Past Medical History  Diagnosis Date  . Breast cancer age 67, recurrent 2010  . Allergy   . Anxiety   . Depression   . OSA (obstructive sleep apnea)     sleep study 6/09 (not on treatment; pt denies OSA)  . Fibromyalgia 2/07  . Hypertension 2006  . Viral meningitis 5/99  . Tricuspid regurgitation 6/02    mild  . Breast cancer      Objective: Filed Vitals:   02/15/13 1402  BP: 118/78  Pulse: 64  Temp: 98.6 F (37 C)  Resp: 16    General appearance: Alert, WD/WN, no distress                             Skin: warm, no rash                           Head: +left maxillary and frontal sinus tenderness,                            Eyes: conjunctiva normal, corneas clear, PERRLA                            Ears: serous fluid of bilat TMs, slight erythema left TM, external ear canals normal                          Nose: septum midline, turbinates swollen, with erythema and clear discharge             Mouth/throat: MMM, tongue normal, mild pharyngeal erythema                           Neck: supple, no adenopathy, no thyromegaly, nontender                          Heart: RRR, normal S1, S2, no murmurs                         Lungs: somewhat decreased breath sounds, but no dullness, no wheezes, rales, or rhonchi      Assessment and Plan:   Encounter  Diagnoses  Name Primary?  . Sinusitis Yes  . Acute bronchitis      Prescription given for Augmentin. C/t flonase, anti-histamine, nasal saline, rest, hydration, Tylenol or Ibuprofen OTC for fever and malaise.  40mg  Depo Medrol given IM in office.  Call or return if worse or not improving in 2-3 days.

## 2013-02-16 ENCOUNTER — Encounter: Payer: Self-pay | Admitting: Family Medicine

## 2013-02-18 ENCOUNTER — Encounter: Payer: Self-pay | Admitting: Medical

## 2013-02-18 ENCOUNTER — Ambulatory Visit (INDEPENDENT_AMBULATORY_CARE_PROVIDER_SITE_OTHER): Payer: No Typology Code available for payment source | Admitting: Medical

## 2013-02-18 VITALS — BP 110/80 | HR 68 | Temp 98.0°F | Resp 18 | Wt 209.0 lb

## 2013-02-18 DIAGNOSIS — J40 Bronchitis, not specified as acute or chronic: Secondary | ICD-10-CM

## 2013-02-18 DIAGNOSIS — J309 Allergic rhinitis, unspecified: Secondary | ICD-10-CM

## 2013-02-18 DIAGNOSIS — J329 Chronic sinusitis, unspecified: Secondary | ICD-10-CM

## 2013-02-18 DIAGNOSIS — J45909 Unspecified asthma, uncomplicated: Secondary | ICD-10-CM

## 2013-02-18 MED ORDER — ALBUTEROL SULFATE HFA 108 (90 BASE) MCG/ACT IN AERS: 2.0000 | INHALATION_SPRAY | Freq: Four times a day (QID) | RESPIRATORY_TRACT | Status: DC | PRN

## 2013-02-18 MED ORDER — METHYLPREDNISOLONE SODIUM SUCC 125 MG IJ SOLR
125.0000 mg | Freq: Once | INTRAMUSCULAR | Status: AC
  Administered 2013-02-18: 125 mg via INTRAMUSCULAR

## 2013-02-18 NOTE — Addendum Note (Signed)
Addended by: Armanda Magic on: 02/18/2013 02:49 PM   Modules accepted: Orders

## 2013-02-18 NOTE — Progress Notes (Signed)
Subjective:  Allison Valencia is a 58 y.o. female who presents for recheck.  I saw her a few days ago for the same.  She started the antibiotic, but still coughing up green sputum.  Doesn't feel as tight in chest compared to the other day, felt some immediate improvement with the Depo-Medrol. She still reports sinus pressure, sinus drainage down back of throat, sneezing, cough, congestion, green sputum, ear pressure, sore throat from drainage. Denies fever, no NVD.  Past history is significant for sinus infections, occasional bronchitis. Patient is a non-smoker.  Using allergy pill and nasal saline and hot tea for symptoms. She has not started back using the Flonase.   She has no inhaler at this time although she has a history of asthma.   In addition to the acute symptoms, she reports postnasal drainage and drippy nose year-round. At times will get very hoarse and lose her voice. No other aggravating or relieving factors.  No other c/o.  ROS as in subjective   Past Medical History  Diagnosis Date  . Breast cancer age 20, recurrent 2010  . Allergy   . Anxiety   . Depression   . OSA (obstructive sleep apnea)     sleep study 6/09 (not on treatment; pt denies OSA)  . Fibromyalgia 2/07  . Hypertension 2006  . Viral meningitis 5/99  . Tricuspid regurgitation 6/02    mild  . Breast cancer   . Asthma      Objective: Filed Vitals:   02/18/13 1336  BP: 110/80  Pulse: 68  Temp: 98 F (36.7 C)  Resp: 18    General appearance: Alert, WD/WN, no distress                             Skin: warm, no rash                           Head: +mild maxillary and frontal sinus tenderness                            Eyes: conjunctiva normal, corneas clear, PERRLA                            Ears: flatTMs, external ear canals normal                          Nose: septum midline, turbinates swollen, with erythema and clear discharge             Mouth/throat: MMM, tongue normal, mild pharyngeal erythema                         Neck: supple, no adenopathy, no thyromegaly, nontender                          Heart: RRR, normal S1, S2, no murmurs                         Lungs: decreased breath sounds, but no dullness, faint wheezes, rales,+ rhonchi      Assessment and Plan:  Encounter Diagnoses  Name Primary?  . Sinobronchitis Yes  . Unspecified asthma(493.90)   . Allergic rhinitis    Sinobronchitis -  We discussed her concerns and symptoms.  She is not particularly worse, possibly a little impatient with her progress so far.  She did feel a big improvement with the Depo-Medrol the other day, and given the ongoing chest symptoms and chest sounds, she requests one more round of steroid which is reasonable.   Thus, 125mg  Solumedrol IM  today.    Recommendations below:  Continue the antibiotic, Augmentin  consider using Mucinex DM which has expectorant and cough suppressant.  This can be taken twice daily for 5 days.  (don't use the D version which contains sudafed).  Do this instead of the antihistamine tablet for now.  continue to drink plenty of water  Use Albuterol inhaler, 2 puffs every 4-6 hours as needed  Consider neti pot/nasal saline flush followed by salt water gargles or warm fluids  Use warm fluids such as tea and coffee  Use the Flonase, 1-2 sprays per nostril twice daily the next week or so  Rest  Voice rest  Warm water with honey and lemon mixture  Allergic rhinitis - Advised that in 3 weeks she should be back to her regular allergy regimen which include Flonase and over-the-counter antihistamine daily.  We discussed other options for her year round postnasal drainage and drippy nose, including possibly a different nasal spray, possibly Singulair.  If she doesn't seem to be seeing significant improvement with her routine allergy symptoms consider one of these other steps.  She will c/t daily nasal saline as well.

## 2013-02-18 NOTE — Patient Instructions (Signed)
Recommendations:  Continue the antibiotic, Augmentin  consider using Mucinex DM which has expectorant and cough suppressant.  This can be taken twice daily for 5 days.  (don't use the D version which contains sudafed).  Do this instead of the antihistamine tablet for now.  continue to drink plenty of water  Use Albuterol inhaler, 2 puffs every 4-6 hours as needed  Consider neti pot/nasal saline flush followed by salt water gargles or warm fluids  Use warm fluids such as tea and coffee  Use the Flonase, 1-2 sprays per nostril twice daily the next week or so  Rest  Voice rest  Warm water with honey and lemon mixture

## 2013-02-28 ENCOUNTER — Encounter: Payer: Self-pay | Admitting: Family Medicine

## 2013-02-28 ENCOUNTER — Ambulatory Visit (INDEPENDENT_AMBULATORY_CARE_PROVIDER_SITE_OTHER): Payer: No Typology Code available for payment source | Admitting: Family Medicine

## 2013-02-28 VITALS — BP 122/76 | HR 68 | Ht 64.0 in | Wt 214.0 lb

## 2013-02-28 DIAGNOSIS — I447 Left bundle-branch block, unspecified: Secondary | ICD-10-CM

## 2013-02-28 DIAGNOSIS — J309 Allergic rhinitis, unspecified: Secondary | ICD-10-CM

## 2013-02-28 DIAGNOSIS — F411 Generalized anxiety disorder: Secondary | ICD-10-CM

## 2013-02-28 MED ORDER — AZELASTINE HCL 0.1 % NA SOLN: 2.0000 | Freq: Two times a day (BID) | NASAL | Status: DC

## 2013-02-28 NOTE — Progress Notes (Signed)
Chief Complaint  Patient presents with  . Depression    nonfasting med check. Would also like to discuss her chronic sinus issues-been going for several months.    Seen 1/20 by Audelia Acton with sinus infection.  Treated with Augmentin and depo-medrol injection. 1/23--given additional steroid injection, and recommended Mucinex DM for ongoing cough.  She continues to have constant drainage, hoarse voice.  She lost her voice for a month in December while she was out in the [employer].  She was treated with steroids, antibiotics, cough meds.  Voice hasn't completely resolved, and sinuses never stopped draining.  The drainage is thick, feels it hanging in her throat, has trouble clearing her throat.  Not discolored, denies fevers. She continues to use Mucinex DM.  She is also using generic zyrtec daily.  Has had allergy testing in past ("everything"--grass, trees, moss, mold, mildew, cats, shellfish).  Had allergy shots twice (for 10 years each, age [age], and again later).  She doesn't want to go that route again.  She knows she is allergic to mold, and things in her environment, but she isn't willing to move from her wooded lot.  She had HEPA filter put in her furnace.  She hasn't been using Flonase chronically, just recently starting using it regularly.  Anxiety--she uses the xanax nightly for sleep.  Occasionally she will fall asleep on her own, and not take it, but if up until midnight, she will take it to get to sleep.  Avoids caffeine.  Got horrible dreams with Lorrin Mais in the past.  She needed it for anxiety when her daughter came home 2 weeks ago, and was fighting with her husband.  Denies side effects from citalopram, and works pretty well, except when stressors are significantly increased (which is likely to happen again as her daughter moves back in tomorrow). Husband resents paying for everything for their daughter, who doesn't have a job, has bipolar disorder.  Past Medical History  Diagnosis Date   . Breast cancer age 85, recurrent 2010  . Allergy   . Anxiety   . Depression   . OSA (obstructive sleep apnea)     sleep study 6/09 (not on treatment; pt refuses CPAP)  . Fibromyalgia 2/07  . Hypertension 2006  . Viral meningitis 5/99  . Tricuspid regurgitation 6/02    mild  . Breast cancer   . Asthma    Past Surgical History  Procedure Laterality Date  . Breast surgery      extended partial R mastectomy with LND age 50; bilateral mastectomy 11/2008, multiple reconstructive surgeries 2011-2012  . Abdominal hysterectomy  03/2009    and BSO; Dr. Raphael Gibney  . Mastectomy    . Vesicovaginal fistula closure w/ tah     History   Social History  . Marital Status: Married    Spouse Name: N/A    Number of Children: N/A  . Years of Education: N/A   Occupational History  . Not on file.   Social History Main Topics  . Smoking status: Never Smoker   . Smokeless tobacco: Never Used  . Alcohol Use: Yes     Comment: 1-2 drinks once or twice a month.  . Drug Use: No  . Sexual Activity: Not on file   Other Topics Concern  . Not on file   Social History Narrative   Lives with husband, has 2 daughters--1 in in [place] with 2 grandkids.  Husband is an alcoholic, in [place].  Daughter is moving back in 01/2013 (broke  up with boyfriend, had been living in [place], [place]  She has bipolar disorder (Tiffany)    Outpatient Encounter Prescriptions as of 02/28/2013  Medication Sig  . albuterol (PROVENTIL HFA;VENTOLIN HFA) 108 (90 BASE) MCG/ACT inhaler Inhale 2 puffs into the lungs every 6 (six) hours as needed for wheezing or shortness of breath.  . ALPRAZolam (XANAX) 1 MG tablet TAKE 1 TABLET BY MOUTH 2-3 TIMES DAILY AS NEEDED FOR ANXIETY  . cetirizine (ZYRTEC) 10 MG tablet Take 10 mg by mouth daily.  . citalopram (CELEXA) 40 MG tablet TAKE 1 TABLET BY MOUTH DAILY  . fluticasone (FLONASE) 50 MCG/ACT nasal spray Place 1 spray into both nostrils daily.  . verapamil (CALAN-SR) 180 MG CR tablet  Take 180 mg by mouth at bedtime.  . nitroGLYCERIN (NITROSTAT) 0.4 MG SL tablet Place 0.4 mg under the tongue every 5 (five) minutes as needed. For chest pain  . [DISCONTINUED] amoxicillin-clavulanate (AUGMENTIN) 875-125 MG per tablet Take 1 tablet by mouth 2 (two) times daily.  . [DISCONTINUED] erythromycin ophthalmic ointment Place into both eyes 2 (two) times daily.   Allergies  Allergen Reactions  . Anectine [Succinylcholine Chloride]     "FAMILY ALLERGY"  . Shellfish Allergy Other (See Comments)    Unknown...dr as child said to never eat   ROS:  Denies fevers, chills, GI complaints.  Denies headaches, cough, shortness of breath.  Occasionally will have symptoms related to LBBB--chest pain, significant fatigue, but overall is doing much better on her current med regimen. Denies depression, significant pain, or other concerns except as per HPI  PHYSICAL EXAM: BP 142/88  Pulse 68  Ht 5\' 4"  (1.626 m)  Wt 214 lb (97.07 kg)  BMI 36.72 kg/m2 122/76 on repeat by MD Pleasant, obese female in no distress HEENT:  PERRL, EOMI, conjunctiva clear.  Nasal mucosa mildly edematous, no erythema or purulence,  Small amt white mucus on left.  Sinuses nontender.  OP is clear.  TM's and EAC's normal bilaterally Neck: no lymphadenopathy, thyromegaly or mass Heart: regular rate and rhythm without murmur Lungs: clear bilaterally Neuro: alert and oriented, cranial nerves intact, normal gait Psych: normal mood, affect, hygiene, grooming, eye contact and speech  ASSESSMENT/PLAN:  Anxiety state, unspecified - controlled, but likely to worsen with tension between her daughter and husband when she moves back tomorrow  Allergic rhinitis, cause unspecified - add astelin.  continue flonase, antihistamine, mucinex.  consider singulair, allergy referral (declines) - Plan: azelastine (ASTELIN) 137 MCG/SPRAY nasal spray  Exercise induced LBBB - stable.  continue current meds  Consider NAMI for her  husband  Drink plenty of fluids. Use the Neti-pot once or twice daily to help get out the mucus that might be draining and causing you symptoms. Continue to use the Flonase once daily (2 sniffs in each nostril). Continue to use Mucinex twice daily (maximum strength 1200mg )--or read directions on bottle. You don't need to use the DM version if you aren't coughing anymore.  You can always get a separate DM in Delsym syrup, to use just as needed for cough. Start Astelin nasal spray--this should also help dry up the drainage/treat the allergies.  You can consider switching antihistamines, but wait for a week or so--don't make too many changes at once, or you won't know what is working. If not improving, we can try Singulair in the future (treats both allergies and asthma)  Consider allergy consultation if not improving. Consider 2 week trial of prilosec--reflux symptoms can sometimes mimic postnasal drainage with  cough, hoarseness, etc.  Will call when med refills are needed (won't need for at least a month)

## 2013-02-28 NOTE — Patient Instructions (Signed)
  Consider NAMI for her husband   Drink plenty of fluids. Use the Neti-pot once or twice daily to help get out the mucus that might be draining and causing you symptoms. Continue to use the Flonase once daily (2 sniffs in each nostril). Continue to use Mucinex twice daily (maximum strength 1200mg )--or read directions on bottle. You don't need to use the DM version if you aren't coughing anymore.  You can always get a separate DM in Delsym syrup, to use just as needed for cough. Start Astelin nasal spray--this should also help dry up the drainage/treat the allergies.  You can consider switching antihistamines, but wait for a week or so--don't make too many changes at once, or you won't know what is working. If not improving, we can try Singulair in the future (treats both allergies and asthma)  Consider allergy consultation if not improving. Consider 2 week trial of prilosec--reflux symptoms can sometimes mimic postnasal drainage with cough, hoarseness, etc.

## 2013-03-18 ENCOUNTER — Other Ambulatory Visit: Payer: Self-pay | Admitting: Family Medicine

## 2013-03-18 NOTE — Telephone Encounter (Signed)
Ok to refill #90, no refill

## 2013-03-18 NOTE — Telephone Encounter (Signed)
IS THIS OKAY TO REFILL 

## 2013-03-18 NOTE — Telephone Encounter (Signed)
I called the medication out to the pharmacy per Dr. Tomi Bamberger. CLS

## 2013-04-11 ENCOUNTER — Telehealth: Payer: Self-pay | Admitting: *Deleted

## 2013-04-11 NOTE — Telephone Encounter (Signed)
Patient returning my call [id number]:[id number] that were dropped off. (I put back on your shelf) Doctor that ordered these labs is the doctor she sees for weightloss and nutrition. Doctor Rupa did vitamin D level after pt complained of excessive fatigue-she has started her on vit D 50,000IU weekly and will be checking for her. Her main concern was that the doctor mentioned something about kidney function that has her concerned. Can you review the CMET and let her know if anything is of concern or any recommedations? Thanks.

## 2013-04-13 NOTE — Telephone Encounter (Signed)
Advise pt--Cr was 1.2 which is higher than this test was back in June.  Sometimes this can be seen if not well hydrated, but kidneys can also be affected by certain supplements (creatine) and anti-inflammatories.  Avoid NSAIDs, and drink plenty of fluids, and this should be rechecked in 1-2 months. I doubt there is any significant problem with the kidney (some labs would call this value normal, but regardless it is higher than usual for her, but not alarming in any way)

## 2013-04-13 NOTE — Telephone Encounter (Signed)
Patient advised. Dr Rosaland Lao is going to recheck.

## 2013-04-21 ENCOUNTER — Other Ambulatory Visit: Payer: Self-pay | Admitting: Family Medicine

## 2013-04-22 ENCOUNTER — Encounter: Payer: Self-pay | Admitting: Family Medicine

## 2013-06-06 ENCOUNTER — Other Ambulatory Visit: Payer: Self-pay | Admitting: Family Medicine

## 2013-06-07 NOTE — Telephone Encounter (Signed)
Is this okay to refill? 

## 2013-06-07 NOTE — Telephone Encounter (Signed)
I called Xanax # 90 no refills to the patients pharmacy. CLS

## 2013-06-07 NOTE — Telephone Encounter (Signed)
Per chart, uses most nights for sleep.  Last rx'd 2/20.  [place] for #90, no refill

## 2013-06-08 MED ORDER — ALPRAZOLAM 1 MG PO TABS: ORAL_TABLET | ORAL | Status: DC

## 2013-06-08 NOTE — Telephone Encounter (Signed)
chandra called in med

## 2013-06-08 NOTE — Addendum Note (Signed)
Addended by: Pernell Dupre A on: 06/08/2013 08:16 AM   Modules accepted: Orders

## 2013-06-13 NOTE — Telephone Encounter (Signed)
It's all done. I didn't realize that the way Dr. Tomi Bamberger response back that a new refill request comes over from her message not the pharmacy. Gabriel Cirri told me. Shane T. Does his reply's different from Dr. Johnsie Kindred. It's all good now that I know that the second request comes from her not the pharmacy. FYI - when I did click refuse there is not to the pharmacy letting them know that it was phoned it. Everything, clear now and Dr. Tomi Bamberger knows what was going on I talk with her on Wednesday morning. Thanks De Hollingshead.

## 2013-08-15 ENCOUNTER — Telehealth: Payer: Self-pay | Admitting: Internal Medicine

## 2013-08-15 ENCOUNTER — Other Ambulatory Visit: Payer: Self-pay | Admitting: *Deleted

## 2013-08-15 MED ORDER — ALPRAZOLAM 1 MG PO TABS: ORAL_TABLET | ORAL | Status: DC

## 2013-08-15 NOTE — Telephone Encounter (Signed)
Last filled #90 on 5/13.  Per last discussion (02/2013), she uses it nightly for sleep, and as needed for anxiety.  It appears that she is needing it more frequently than in the past prn for anxiety.  Okay to refill #90, but please schedule med check (last visit Feb, due for 6 month med check August, given ongoing need for controlled substance, f/u anxiety; also for her HTN--last labs were 06/2012.  Doesn't look like she has had CPE here, no immunizations in system. Probably should schedule CPE at some point

## 2013-08-15 NOTE — Telephone Encounter (Signed)
Pt needs a refill on her [facility]anax to [facility]

## 2013-08-15 NOTE — Telephone Encounter (Signed)
Called in rx to [facility]. Called patient to schedule med check for end of August. Patient was driving with her grand children in the car on her way home. I asked her to please call me back when she got home to schedule. Patient states that she will call me back next week after her grand children go back home.

## 2013-10-06 ENCOUNTER — Ambulatory Visit (INDEPENDENT_AMBULATORY_CARE_PROVIDER_SITE_OTHER): Payer: No Typology Code available for payment source | Admitting: Family Medicine

## 2013-10-06 ENCOUNTER — Encounter: Payer: Self-pay | Admitting: Family Medicine

## 2013-10-06 VITALS — BP 130/80 | HR 68 | Ht 64.0 in | Wt 205.0 lb

## 2013-10-06 DIAGNOSIS — J4521 Mild intermittent asthma with (acute) exacerbation: Secondary | ICD-10-CM

## 2013-10-06 DIAGNOSIS — F411 Generalized anxiety disorder: Secondary | ICD-10-CM

## 2013-10-06 DIAGNOSIS — IMO0001 Reserved for inherently not codable concepts without codable children: Secondary | ICD-10-CM

## 2013-10-06 DIAGNOSIS — F329 Major depressive disorder, single episode, unspecified: Secondary | ICD-10-CM

## 2013-10-06 DIAGNOSIS — F3289 Other specified depressive episodes: Secondary | ICD-10-CM

## 2013-10-06 DIAGNOSIS — J45901 Unspecified asthma with (acute) exacerbation: Secondary | ICD-10-CM

## 2013-10-06 DIAGNOSIS — E669 Obesity, unspecified: Secondary | ICD-10-CM

## 2013-10-06 DIAGNOSIS — M797 Fibromyalgia: Secondary | ICD-10-CM

## 2013-10-06 DIAGNOSIS — G4700 Insomnia, unspecified: Secondary | ICD-10-CM

## 2013-10-06 MED ORDER — ALPRAZOLAM 1 MG PO TABS: ORAL_TABLET | ORAL | Status: DC

## 2013-10-06 MED ORDER — BUPROPION HCL ER (XL) 150 MG PO TB24: 150.0000 mg | ORAL_TABLET | Freq: Every day | ORAL | Status: DC

## 2013-10-06 MED ORDER — AZITHROMYCIN 250 MG PO TABS: ORAL_TABLET | ORAL | Status: DC

## 2013-10-06 NOTE — Patient Instructions (Signed)
Start wellbutrin once daily in mornings. Continue citalopram.  Call in 4-6 weeks (or message [place]) to let us know how you're doing. We can consider restarting your other weight loss medication at that time, if needed.  Take the zpak. Cut the citalopram in 1/2 while taking it (then go back up to full pill).  Use the albuterol as needed for wheezing/dry, hacky cough.

## 2013-10-06 NOTE — Progress Notes (Signed)
Chief Complaint  Patient presents with  . med check    med check. declines flu shot. antidepressant is not working and would like to talk about it today. has something going with her sinus and going to her mother's next week and just wants to be sure she doesnt give it to her. would like to talk about weight loss.   She is complaining of feeling "blah", "no get-up and go". She is napping during the day. She just "doesn't care".  She feels part is the stress of her husband and her daughterTiffany, and having to be the mediator.  Constant stressor, they are always fighting.  Feels this way since she moved back home.  She stays in the basement playing with the dogs most of the day.  Husband is upset she hasn't gotten a job, and that they are still supporting her.  She feels bad that her daughter "has no life".   Plans to seek support group. Other stressors that she reports is the need to see her friend Fraser Din daily--she gets calls/texts at 5:30 if she isn't there yet, expects daily visit. She has developed some dementia, and Stefany checks in on her daily.  This expectation (and having to drop everything, and be available at that time) is causing some stress. Her mother has dementia--going to see her in [place] next week.  Brother is there visits daily.  Researching signing house over to her name, needs to get more info, and have the discussion with her mother. Her grandkids make her happy.  Denies HI/SI  Initially the citalopram was more for anxiety (not depression). She was given xanax to use prn, which helped but made her sleepy.  She then was using it nightly for sleep.  It works well for sleep, and she has been using it nightly--she has been out for 5 days.  Simply Sleep used to work, but hasn't worked recently.  She tried husband's Lunesta in past--didn't help. Recalls getting bad dreams from other meds Lorrin Mais? Knows she tried it and didn't do well).  Frustrated with her insurance, dealing with Glass blower/designer.  Costs increased, felt tricked into it. None of her doctors are Tier 1, so cost a lot more or has to go to [facility].  High out of pocket expense.  She can no longer see bariatric/weight loss doctor due to cost.  Previously saw a weight loss doctor at [facility] in [place]. She was diagnosed there with vitamin D deficiency.  She had labs there about 4-5 months ago.  These are scanned in-- 02/2013. Vit D 25, slightly high PTH.  Normal TSH, CBC, chem She had been on phentermine--worked, but only short term. Prefers to try longer term medication.   She lost 3#/month when taking diethylpropion.  It eventually plateau'd some, and that is when she switched to phentermine.  She is now asking to go back on it.  Hasn't been taking any meds recently. She recalls that she took Qsymia x 2 months, didn't help. She also thinks she tried Contrave (can't recall).   She reports that her diet has been good (despite poor moods)--still watching her carbs and calories.  Exercise is limited due to her knee pain. Over the winter she went to a gym that had indoor pool.  She enjoyed this, but the chlorine bothered her, made her itch.  She is taking meloxicam for her knees (bursitis)--she states she limped around all summer, is finally somewhat better now. She saw Dr. Ninfa Linden (  and had cortisone shot). Fibromyalgia hasn't been bothering her too much lately  She has been sick with cold symptoms since her grandson visited, about 2-3 weeks ago.  She is coughing up green phlegm.  No sinus pain. She has congestion in her chest. She is worried about being contagious and getting her mother sick.  She hasn't been using her inhaler.  Past Medical History  Diagnosis Date  . Breast cancer age 5, recurrent 2010  . Allergy   . Anxiety   . Depression   . OSA (obstructive sleep apnea)     sleep study 6/09 (not on treatment; pt refuses CPAP)  . Fibromyalgia 2/07  . Hypertension 2006  . Viral meningitis 5/99  .  Tricuspid regurgitation 6/02    mild  . Breast cancer   . Asthma    Past Surgical History  Procedure Laterality Date  . Breast surgery      extended partial R mastectomy with LND age 1; bilateral mastectomy 11/2008, multiple reconstructive surgeries 2011-2012  . Abdominal hysterectomy  03/2009    and BSO; Dr. Raphael Gibney  . Mastectomy    . Vesicovaginal fistula closure w/ tah     History   Social History  . Marital Status: Married    Spouse Name: N/A    Number of Children: N/A  . Years of Education: N/A   Occupational History  . Not on file.   Social History Main Topics  . Smoking status: Never Smoker   . Smokeless tobacco: Never Used  . Alcohol Use: Yes     Comment: 1-2 drinks once or twice a month.  . Drug Use: No  . Sexual Activity: Not on file   Other Topics Concern  . Not on file   Social History Narrative   Lives with husband, has 2 daughters--1 in in [place] with 2 grandkids.  Husband is an alcoholic, in [place].  Daughter is moving back in 01/2013 (broke up with boyfriend, had been living in [place], [place]).  She has bipolar disorder (Tiffany)   Outpatient Encounter Prescriptions as of 10/06/2013  Medication Sig Note  . ALPRAZolam (XANAX) 1 MG tablet TAKE 1 TABLET BY MOUTH 2-3 TIMES DAILY AS NEEDED FOR ANXIETY   . citalopram (CELEXA) 40 MG tablet take 1 tablet by mouth once daily   . verapamil (CALAN-SR) 180 MG CR tablet Take 180 mg by mouth at bedtime.   . [DISCONTINUED] ALPRAZolam (XANAX) 1 MG tablet TAKE 1 TABLET BY MOUTH 2-3 TIMES DAILY AS NEEDED FOR ANXIETY   . albuterol (PROVENTIL HFA;VENTOLIN HFA) 108 (90 BASE) MCG/ACT inhaler Inhale 2 puffs into the lungs every 6 (six) hours as needed for wheezing or shortness of breath. 02/28/2013: Uses prn  . azelastine (ASTELIN) 137 MCG/SPRAY nasal spray Place 2 sprays into both nostrils 2 (two) times daily. Use in each nostril as directed   . azithromycin (ZITHROMAX) 250 MG tablet Take 2 tablets by mouth on first day, then 1  tablet by mouth on days 2 through 5   . buPROPion (WELLBUTRIN XL) 150 MG 24 hr tablet Take 1 tablet (150 mg total) by mouth daily.   . cetirizine (ZYRTEC) 10 MG tablet Take 10 mg by mouth daily.   . fluticasone (FLONASE) 50 MCG/ACT nasal spray Place 1 spray into both nostrils daily.   . nitroGLYCERIN (NITROSTAT) 0.4 MG SL tablet Place 0.4 mg under the tongue every 5 (five) minutes as needed. For chest pain    (wellbutrin added today)  Allergies  Allergen  Reactions  . Anectine [Succinylcholine Chloride]     "FAMILY ALLERGY"  . Shellfish Allergy Other (See Comments)    Unknown...dr as child said to never eat   ROS:  Denies fevers, chills, nausea, vomiting, diarrhea, abdominal pain.  Appetite has been okay.  +depressed mood and irritability, lack of motivation. +knee pain. +cough.  No exertional chest pain, palpitations, edema, bleeding, bruising, rash or other concerns, except as noted in HPI.  PHYSICAL EXAM: BP 130/80  Pulse 68  Ht 5\' 4"  (1.626 m)  Wt 205 lb (92.987 kg)  BMI 35.17 kg/m2 Well developed, pleasant female in no distress.  Speaking easily without significant cough (until taking deep breaths during exam) HEENT:  PERRL, EOMI, conjunctiva clear.  OP clear. Sinuses nontender Neck: no lymphadenopathy, thyromegaly or mass Heart: regular rate and rhythm Lungs: Coarse breath sounds with wheezing and cough upon expiration.  Good air movement throughout no rales/ronchi Psych: became tearful in discussing home situation.  Full range of affect. Normal eye contact, speech, hygiene and grooming Neuro: alert and oriented. Cranial nerves intact.  Normal strength, gait  ASSESSMENT/PLAN:  Asthmatic bronchitis with exacerbation, mild intermittent - acute illness, bacterial.  Use albuterol prn (has, hasn't been using it), and take ABX - Plan: azithromycin (ZITHROMAX) 250 MG tablet  Anxiety state, unspecified - Plan: buPROPion (WELLBUTRIN XL) 150 MG 24 hr tablet, ALPRAZolam (XANAX) 1 MG  tablet  Obesity (BMI 30-39.9) - due to insurance issues, can't afford to go back to [facility] bariat[facility] clinic. Tried many meds, currently off all.  Has not regained all weight. Not exercising  Depressive disorder, not elsewhere classified - suboptimally controlled, related to stressors of husband and daughter (fighting), and her mother. Counseling encouraged.  Add Wellbutrin - Plan: buPROPion (WELLBUTRIN XL) 150 MG 24 hr tablet  Insomnia - requires nightly xanax. Reviewed risks/benefits, and reasons for my hesitation in using benzo for sleep. intolerant of other meds; cont.  Fibromyalgia - overall well controlled at this point. Not interested in [employer] study  Depression/anxiety--suboptimally controlled.  Continue citalopram.  Add Wellbutrin XL 150mg  daily. Risks/side effects reviewed. Encouraged counseling (individual)--she prefers to start with support group. Counseled re: xanax risks for insomnia.  Perhaps reason Simply Sleep isn't as effective as in the past when used is due to chronic benzo use.  Plans to continue nightly for now.  Obesity--discussed that I have no experience with the weight loss med she is asking for.  It appears to have some similar action to wellbutrin, and can also lower seizure threshold, but has no contraindication (just caution).   After on Wellbutrin for a month (which potentially also can help with weight, especially if depression improves, motivation to exercise is better, etc), consider adding back weight loss medication. Other weight loss medications briefly discussed.   Belviq (not rec due to increased risk of serotonin syndrome), and she thinks she tried Contrave, and had no results with Qsymia.  Asthmatic bronchitis:  z-pak (cut citalopram dose in 1/2 while taking) and use albuterol as needed.  Declines flu shot due to illness. Recommended she get when she is well.  45 minute visit, all face to face with more than 1/2 spent counseling (anxiety/depression,  medications, side effects, diet, exercise)  F/u by phone or message within the next 4-6 weeks (due to high deductible and costs currently)

## 2013-11-17 ENCOUNTER — Telehealth: Payer: Self-pay | Admitting: Internal Medicine

## 2013-11-17 MED ORDER — DIETHYLPROPION HCL ER 75 MG PO TB24: 1.0000 | ORAL_TABLET | Freq: Every day | ORAL | Status: DC

## 2013-11-17 NOTE — Telephone Encounter (Signed)
Pt called stating that the wellbutrin is working great for her and she sees no side effects. She would like to add the weight loss med Diethylpropion ER 75mg  to her meds. [facility]

## 2013-11-17 NOTE — Telephone Encounter (Signed)
Patient advised of possible side effects and she will call if she should experience anything.

## 2013-11-17 NOTE — Telephone Encounter (Signed)
Ok please let pt know

## 2013-11-17 NOTE — Telephone Encounter (Signed)
Spoke with pharmacist and she did mention that seizure threshold can be lowered, but I did let her know that patient has no history of seizures. She did mention that is could possibly make patient more anxious or jittery and should probably be closely monitored. I called in #30 with no refills and did let pharmacist know that I would report back to you with what she said. Also, she will have to order this medication, as she did not have in stock-will be available in a few days and they will call patient when they receive it.

## 2013-11-17 NOTE — Telephone Encounter (Signed)
Can you please double check with the pharmacy that there is no contraindication with taking this medication along with wellbutrin (similar sounding med--not sure if they shouldn't be used together).  Likely, with many meds, the seizure threshold is lowered, but she has no h/o seizures.  If pharmacy doesn't report any contraindications, then okay to call in #30 of med, with pt given instructions to call in 4 weeks with update on her weight, any problems with med (she has high deductible, so if doing well, may only need to have her come back for f/u in 3 months time, rather than monthly).

## 2013-11-29 ENCOUNTER — Other Ambulatory Visit: Payer: Self-pay | Admitting: Family Medicine

## 2013-11-29 DIAGNOSIS — F329 Major depressive disorder, single episode, unspecified: Secondary | ICD-10-CM

## 2013-11-29 DIAGNOSIS — F32A Depression, unspecified: Secondary | ICD-10-CM

## 2013-11-29 DIAGNOSIS — F419 Anxiety disorder, unspecified: Principal | ICD-10-CM

## 2013-11-29 NOTE — Telephone Encounter (Signed)
Is this okay to refill? 

## 2013-11-29 NOTE — Telephone Encounter (Signed)
done

## 2013-12-14 ENCOUNTER — Other Ambulatory Visit: Payer: Self-pay | Admitting: Family Medicine

## 2013-12-14 NOTE — Telephone Encounter (Signed)
done

## 2013-12-14 NOTE — Telephone Encounter (Signed)
I know this med was added at last visit. Patient did call in 4-6 weeks as asked (tel encounter 11/17/13) and stated that it was working great (when she called and asked about weightloss med). Is this okay to fill and for how long? Thanks.

## 2013-12-15 ENCOUNTER — Telehealth: Payer: Self-pay | Admitting: Internal Medicine

## 2013-12-15 NOTE — Telephone Encounter (Signed)
Pt called stating that she was seen in the summer by Dr. Ninfa Linden PA for buritis of the knees and was given mobic 7.5mg  1 tablet in am and 1 tablet in pm. Pt is having a flare up really bad with her fibromyalgia and the 7.5mg  is not touching any of her pain and wants to know can she go up to the 15mg  when she takes it. Please advise pt

## 2013-12-15 NOTE — Telephone Encounter (Signed)
She hasn't had a chem panel (looking at kidney function) in over a year.  If only using it short-term, that is okay. If using it chronically, needs labwork.  Okay to increased to 15mg  daily (can actually be taken both pills at the same time, rather than BID).  Make sure that she isn't taking any other NSAID's with it.  Max dose is 15mg  daily, so canNOT take 15mg  BID (not sure exactly what she is asking)   (it doesn't look like she is asking for rx in this message, just okaying increase dose?)

## 2013-12-15 NOTE — Telephone Encounter (Signed)
Patient advised and verbalized understanding 

## 2013-12-24 ENCOUNTER — Other Ambulatory Visit: Payer: Self-pay | Admitting: Family Medicine

## 2013-12-24 DIAGNOSIS — Z79899 Other long term (current) drug therapy: Secondary | ICD-10-CM

## 2013-12-24 DIAGNOSIS — F411 Generalized anxiety disorder: Secondary | ICD-10-CM

## 2013-12-26 ENCOUNTER — Telehealth: Payer: Self-pay | Admitting: Internal Medicine

## 2013-12-26 ENCOUNTER — Encounter: Payer: Self-pay | Admitting: *Deleted

## 2013-12-26 MED ORDER — MELOXICAM 15 MG PO TABS: 15.0000 mg | ORAL_TABLET | Freq: Every day | ORAL | Status: DC

## 2013-12-26 MED ORDER — ALPRAZOLAM 1 MG PO TABS: ORAL_TABLET | ORAL | Status: DC

## 2013-12-26 NOTE — Telephone Encounter (Signed)
Called patient and she states that diethylpropion is working great, no side effects. Feels like she has lost weight-does not use scale but her clothing is fitting more loosely. She also asked for a refill on her xanax. And also asked if you would give her an rx for mobic 15mg  once daily. (see phone call from 12/15/13) Dr. Rush Farmer had given her 7.5mg  BID-started taking 15mg  daily and has really helped with her fibromyalgia. She does not want to take "long-term" would really just like to take for another month to see if she can "get the inflammation down." I told her she would need labwork to take long term. Please advise.

## 2013-12-26 NOTE — Telephone Encounter (Signed)
I believe when we rc'd this, she was told to call in 4 weeks with update on her weight, any problems with med (she has high deductible, so if doing well, may only need to have her come back for f/u in 3 months time, rather than monthly).

## 2013-12-26 NOTE — Telephone Encounter (Signed)
Xanax was last filled 9/10 for #90.  Okay to refill #90. mobic was rx'd early November for #60, so should be running out if taking 2/day.  Okay to rx 87m once daily prn pain #30 with 1 refill.  I would like for her to have c-met (last was 06/2012).  Okay to refill the weight loss med for another month, and have her check back with [place] next [place] prior to refill.  Will need visit after taking x 3 months. Likely should schedule CPE (has never had one here).  There may be other labs needed that we will do at CPE (ie TSH).  If her appointment is after her birthday ([birth year]), then have her check with her insurance re: zostavax coverage prior to visit. We don't have any immunizations in the system (when was last tetanus?)-these are things we would cover at CPE

## 2013-12-26 NOTE — Telephone Encounter (Signed)
error 

## 2013-12-26 NOTE — Telephone Encounter (Signed)
Scheduled patient for next available CPE 06/28/14. She will check with insurance [id number] and immuninizations. Called in her meds and scheduled her for CMEt next Monday 01/02/14. Placed future order.

## 2013-12-26 NOTE — Telephone Encounter (Signed)
Is this okay?

## 2014-01-02 ENCOUNTER — Other Ambulatory Visit: Payer: Self-pay

## 2014-01-05 ENCOUNTER — Encounter (HOSPITAL_COMMUNITY): Payer: Self-pay | Admitting: Cardiology

## 2014-01-05 ENCOUNTER — Other Ambulatory Visit: Payer: No Typology Code available for payment source

## 2014-01-05 DIAGNOSIS — Z79899 Other long term (current) drug therapy: Secondary | ICD-10-CM

## 2014-01-05 LAB — COMPREHENSIVE METABOLIC PANEL
ALT: 16 U/L (ref 0–35)
AST: 17 U/L (ref 0–37)
Albumin: 4 g/dL (ref 3.5–5.2)
Alkaline Phosphatase: 62 U/L (ref 39–117)
BILIRUBIN TOTAL: 0.3 mg/dL (ref 0.2–1.2)
BUN: 24 mg/dL — AB (ref 6–23)
CO2: 26 mEq/L (ref 19–32)
CREATININE: 1.18 mg/dL — AB (ref 0.50–1.10)
Calcium: 9 mg/dL (ref 8.4–10.5)
Chloride: 103 mEq/L (ref 96–112)
GLUCOSE: 95 mg/dL (ref 70–99)
Potassium: 4.9 mEq/L (ref 3.5–5.3)
Sodium: 137 mEq/L (ref 135–145)
Total Protein: 6.6 g/dL (ref 6.0–8.3)

## 2014-01-30 ENCOUNTER — Encounter: Payer: Self-pay | Admitting: Family Medicine

## 2014-01-30 ENCOUNTER — Telehealth: Payer: Self-pay | Admitting: Family Medicine

## 2014-01-30 NOTE — Telephone Encounter (Signed)
Addressed through My Chart 

## 2014-01-30 NOTE — Telephone Encounter (Signed)
Pt called to make sure that Dr. Tomi Bamberger saw her message through [place]. Pt has loss her voice so she is not able to speak very well over the phone

## 2014-02-01 ENCOUNTER — Other Ambulatory Visit: Payer: Self-pay | Admitting: Family Medicine

## 2014-02-01 NOTE — Telephone Encounter (Signed)
Spoke with patient and she states that she is not having any side effects from the medication. She has not checked her bp's and does not  Know her current weight. She feels like the medication is helping, it was just difficult through the holidays. She went ahead and scheduled appt for 03/06/14 for 3 month follow up and medication was called into [facility] for #30.

## 2014-02-01 NOTE — Telephone Encounter (Signed)
This is what I said when refill was requested last month (11/30):  "Okay to refill the weight loss med for another month, and have her check back with [place] again next month prior to refill. Will need visit after taking x 3 months."  So, I need to know how it is working for her--what is her weight, is she monitoring BP at all, any side effects.  If she is losing weight, and tolerating well, okay to refill an additional #30. As previously stated, she will need a visit before any further refills after this one (3 months)

## 2014-02-01 NOTE — Telephone Encounter (Signed)
Is this okay?

## 2014-03-04 ENCOUNTER — Other Ambulatory Visit: Payer: Self-pay | Admitting: Family Medicine

## 2014-03-06 NOTE — Telephone Encounter (Signed)
No; needs to be done at visit

## 2014-03-06 NOTE — Telephone Encounter (Signed)
Patient has appt next Monday for follow up on this med-is it okay to refill?

## 2014-03-08 ENCOUNTER — Ambulatory Visit: Payer: Self-pay | Admitting: Family Medicine

## 2014-03-08 ENCOUNTER — Telehealth: Payer: Self-pay | Admitting: Family Medicine

## 2014-03-08 NOTE — Telephone Encounter (Signed)
[facility] [address] is requesting refill on Diethylpropion ER 75 mg Tablet 1 po qd.

## 2014-03-08 NOTE — Telephone Encounter (Signed)
Called and advised pharmacy

## 2014-03-08 NOTE — Telephone Encounter (Signed)
This was denied until pt's upcoming f/u

## 2014-03-13 ENCOUNTER — Ambulatory Visit: Payer: Self-pay | Admitting: Family Medicine

## 2014-03-21 ENCOUNTER — Other Ambulatory Visit: Payer: Self-pay | Admitting: Family Medicine

## 2014-03-21 NOTE — Telephone Encounter (Signed)
She is using this nightly for sleep (and prn anxiety).  I really don't like using this chronically for sleep. I would like to discuss other meds (ie Belsomra) with her.  She was to have had a visit in January, but she canceled it (glad I didn't refill her weight loss med). If she doesn't want to come in sooner for a med check, then okay to refill #90. She has CPE scheduled for June.  But I'd like you to offer her a med check to discuss newer, safer options

## 2014-03-21 NOTE — Telephone Encounter (Signed)
Is this okay to refill? Last visit was on 09/2013

## 2014-03-21 NOTE — Telephone Encounter (Signed)
Is this okay to refill? 

## 2014-03-22 NOTE — Telephone Encounter (Signed)
Is this okay to call in? 

## 2014-03-22 NOTE — Telephone Encounter (Signed)
Did you see my note?

## 2014-03-22 NOTE — Telephone Encounter (Signed)
Spoke with patient and she states that she has a lot going on in the next few months and would really prefer to wait and discuss in June at her CPE. I went ahead and called in rx to pharmacy for #90.

## 2014-04-04 ENCOUNTER — Telehealth: Payer: Self-pay | Admitting: Family Medicine

## 2014-04-04 ENCOUNTER — Other Ambulatory Visit: Payer: Self-pay | Admitting: Medical

## 2014-04-04 MED ORDER — ALBUTEROL SULFATE HFA 108 (90 BASE) MCG/ACT IN AERS: 2.0000 | INHALATION_SPRAY | Freq: Four times a day (QID) | RESPIRATORY_TRACT | Status: DC | PRN

## 2014-04-04 NOTE — Telephone Encounter (Signed)
Advise pt that refill was sent.  She should schedule OV if not improving

## 2014-04-04 NOTE — Telephone Encounter (Signed)
Pt called stating that she is having difficulty breathing and wanted to know if she could get the refill on Albuterol Inhaler today

## 2014-04-05 NOTE — Telephone Encounter (Signed)
Continue supportive measures (mucinex, etc). If shortness of breath isn't helped by the inhaler, or if worsening, then OV is recommended (does she need CXR, steroids, is this pneumonia, flu, etc).  If she feels like the inhaler is helping, and she wants to give it a couple of days to see if getting worse, that's fine

## 2014-04-05 NOTE — Telephone Encounter (Signed)
Pt notified of Dr.knapp recommendations

## 2014-04-05 NOTE — Telephone Encounter (Signed)
Called pt to advise her of Dr Johnsie Kindred instructions. She said she called Dr Redmond School after hours to get the refill because her breathing started to get worse. She had a bad night with shortness of breath, coughing, chills,sinus drainage and she thinks she may had a slight fever. She does not have chills or feels that she has a fever this morning. Still has cough, shortness of breath and lethargic. Using inhaler, mucinex, fluticasone nasal spray. Should she come in for appointment or continue what she is doing?

## 2014-04-06 ENCOUNTER — Ambulatory Visit (INDEPENDENT_AMBULATORY_CARE_PROVIDER_SITE_OTHER): Payer: 59 | Admitting: Family Medicine

## 2014-04-06 ENCOUNTER — Encounter: Payer: Self-pay | Admitting: Family Medicine

## 2014-04-06 VITALS — BP 132/84 | HR 72 | Temp 98.2°F | Resp 16 | Wt 203.4 lb

## 2014-04-06 DIAGNOSIS — J301 Allergic rhinitis due to pollen: Secondary | ICD-10-CM | POA: Diagnosis not present

## 2014-04-06 DIAGNOSIS — J4521 Mild intermittent asthma with (acute) exacerbation: Secondary | ICD-10-CM | POA: Diagnosis not present

## 2014-04-06 MED ORDER — AZITHROMYCIN 500 MG PO TABS: 500.0000 mg | ORAL_TABLET | Freq: Every day | ORAL | Status: DC

## 2014-04-06 NOTE — Progress Notes (Signed)
   Subjective:    Patient ID: Allison Valencia, female    DOB: 22-Feb-1955, 59 y.o.   MRN: [id number]  HPI She complains of a three-day history this started with shortness breath, coughing followed by headache, fever, nasal congestion, myalgias and fatigue. She has a remote history of asthma and has had the use an inhaler very rarely in the past. She does have a history of allergic rhinitis and mainly has trouble with PND. Prior to this she did spend a week with her grandchildren who apparently did have some viral infections. She does not smoke or drink.   Review of Systems     Objective:   Physical Exam Alert and in no distress. Tympanic membranes and canals are normal. Pharyngeal area is normal. Neck is supple without adenopathy or thyromegaly. Cardiac exam shows a regular sinus rhythm without murmurs or gallops. Lungs show diffuse rhonchi and wheezing        Assessment & Plan:  Allergic rhinitis due to pollen  Asthmatic bronchitis with exacerbation, mild intermittent - Plan: azithromycin (ZITHROMAX) 500 MG tablet  instructed her to use the albuterol regularly. She will call if she is no better in one week. She was comfortable with this. Also encouraged her to use an antihistamine for her allergy symptoms which seem to be mainly nasal congestion and PND.

## 2014-04-06 NOTE — Patient Instructions (Signed)
Use Claritin or Allegra for your drainage. If you're still having symptoms after week call me back

## 2014-04-14 ENCOUNTER — Telehealth: Payer: Self-pay | Admitting: Internal Medicine

## 2014-04-14 MED ORDER — AMOXICILLIN-POT CLAVULANATE 875-125 MG PO TABS: 1.0000 | ORAL_TABLET | Freq: Two times a day (BID) | ORAL | Status: DC

## 2014-04-14 NOTE — Telephone Encounter (Signed)
Pt states that she saw Dr. Redmond School on 04/06/14 and was given antibiotic and she was doing better for a few days and now she is back to coughing up stuff, congestion and lots of drainage and has some chest discomfort. Pt has been using claritin allergy pills, inhaler, and nose spray and nothing seems to help. Can you [facility] something else [facility]

## 2014-04-14 NOTE — Telephone Encounter (Signed)
Advise pt I'm sending Augmentin to her pharmacy. Follow up next week if not better.

## 2014-04-14 NOTE — Telephone Encounter (Signed)
Patient advised.

## 2014-05-17 ENCOUNTER — Telehealth: Payer: Self-pay | Admitting: Family Medicine

## 2014-05-17 NOTE — Telephone Encounter (Signed)
Spoke with patient and she does not want to come in earlier for separate appt. I did put her on the cancellation list for an earlier CPE. Just an FYI.

## 2014-05-17 NOTE — Telephone Encounter (Signed)
Pt called and requested a refill on diethylpropion. She states she had a weight check at last ov. [facility] on [address].

## 2014-05-17 NOTE — Telephone Encounter (Signed)
She had an acute visit with another provider, not follow up with me on her weight (she had canceled her last appt with me).  Her weight was down only 1.5 pounds since her visit in September, so it doesn't seem to be effective. No refill.Can discuss further at appt in June, can schedule sooner if desired.

## 2014-06-12 ENCOUNTER — Telehealth: Payer: Self-pay | Admitting: *Deleted

## 2014-06-12 ENCOUNTER — Encounter: Payer: Self-pay | Admitting: Family Medicine

## 2014-06-12 ENCOUNTER — Ambulatory Visit (INDEPENDENT_AMBULATORY_CARE_PROVIDER_SITE_OTHER): Payer: 59 | Admitting: Family Medicine

## 2014-06-12 ENCOUNTER — Other Ambulatory Visit: Payer: Self-pay | Admitting: *Deleted

## 2014-06-12 VITALS — BP 116/70 | HR 64 | Ht 64.0 in | Wt 213.8 lb

## 2014-06-12 DIAGNOSIS — F411 Generalized anxiety disorder: Secondary | ICD-10-CM | POA: Diagnosis not present

## 2014-06-12 DIAGNOSIS — E669 Obesity, unspecified: Secondary | ICD-10-CM

## 2014-06-12 DIAGNOSIS — M797 Fibromyalgia: Secondary | ICD-10-CM | POA: Diagnosis not present

## 2014-06-12 DIAGNOSIS — I447 Left bundle-branch block, unspecified: Secondary | ICD-10-CM

## 2014-06-12 DIAGNOSIS — I2510 Atherosclerotic heart disease of native coronary artery without angina pectoris: Secondary | ICD-10-CM | POA: Diagnosis not present

## 2014-06-12 DIAGNOSIS — G4733 Obstructive sleep apnea (adult) (pediatric): Secondary | ICD-10-CM | POA: Diagnosis not present

## 2014-06-12 DIAGNOSIS — M25551 Pain in right hip: Secondary | ICD-10-CM

## 2014-06-12 DIAGNOSIS — M25552 Pain in left hip: Secondary | ICD-10-CM

## 2014-06-12 MED ORDER — PREGABALIN 50 MG PO CAPS: 50.0000 mg | ORAL_CAPSULE | Freq: Three times a day (TID) | ORAL | Status: DC

## 2014-06-12 MED ORDER — DIETHYLPROPION HCL ER 75 MG PO TB24: 1.0000 | ORAL_TABLET | Freq: Every day | ORAL | Status: DC

## 2014-06-12 MED ORDER — METHYLPREDNISOLONE ACETATE 80 MG/ML IJ SUSP
80.0000 mg | Freq: Once | INTRAMUSCULAR | Status: AC
  Administered 2014-06-12: 80 mg via INTRAMUSCULAR

## 2014-06-12 MED ORDER — ALPRAZOLAM 1 MG PO TABS: ORAL_TABLET | ORAL | Status: DC

## 2014-06-12 NOTE — Telephone Encounter (Signed)
Patient requesting xanax be called in, has only a few pills left.

## 2014-06-12 NOTE — Patient Instructions (Signed)
  Consider taking Tylenol Arthritis as needed for pain, especially in the evening. Start Lyrica 50mg  at bedtime for a couple of nights, and if no problems, increase to twice daily for a few days, and if tolerating without significant daytime drowsiness, increase to 1 capsule three times daily.  After 1-2 weeks, you can further increase to 2 capsules at bedtime.  When we see you at your physical we can further titrate up, depending on how you are doing.  I usually prescribe 75mg  twice daily, and gradually increase, but we only have 50mg  samples (so three times daily is the same total daily dose).  We are giving you a steroid injection today that should help with any acute inflammation that might be contributing to your pain.,  Do the hip and iliotibial band stretches as shown.  If you would like referral back to [facility], let me know (not sure if they take your current insurance or not, may need to call).

## 2014-06-12 NOTE — Progress Notes (Signed)
Chief Complaint  Patient presents with  . Fibromyalgia    has been having pain all over her body for several months-unsure if it's fibro or arthritis. Cannot sleep at night due the pain.    It hurts "everywhere". She could hardly walk around [place] last weekend--had to use [place] for a few blocks.  She has PF flare in left foot--wearing special shoes. Also has some pain in the lateral part of the foot.  She also has pain in her knees, lateral hips, bilateral buttocks, bilateral shoulders, neck. "I walk like an old lady and I hate it". Current pain--at rest 3/10, but with moving, 8-9/10.  Can't get comfortable at night to sleep, mainly in the lateral hips.  It hurts even when she is on her back, not just on her sides.  All positions, even with pillows between knees, nothing helps. She reports being very resistant to pain medications, ultram and narcotics don't really work for her, just IV pain meds.  She has been taking the meloxicam once daily for about a year (rx'd by ortho)..  Originally prescribed for her knees, but never really got much benefit.  Has tried Tylenol sporadically, but it doesn't help much. No swollen, hot joints.  She previously saw Dr. Estanislado Pandy, who treated her for years for fibromyalgia.  She had PT through [facility].  She had taken muscle relaxers (didn't help).  Doesn't recall ever being on Cymbalta, Lyrica, Gabapentin [facility]the [facility] bothered her, causing itchy skin.  Last year she saw ortho (Dr. Ninfa Linden), had injections into both knees, had MRI's, and he prescribed meloxicam.    She is requesting refill on weight loss medication.  She never really followed up while taking the medication.  She had an acute visit with Dr. Redmond School in March, and her weight at that time was unchanged from prior to being on weight loss medication in September.  Since March, she has gained an additional 10 pounds (had been  off meds for about a month at that time, last refill was in January 2016). She gained 10# in the last 2 months She reports that she gained due to being "unable to move from pain".  She is requesting refills on the diethylpropion and alprazolam.  PMH, PSH, SH, FH reviewed and updated Current Outpatient Prescriptions on File Prior to Visit  Medication Sig Dispense Refill  . azelastine (ASTELIN) 137 MCG/SPRAY nasal spray Place 2 sprays into both nostrils 2 (two) times daily. Use in each nostril as directed 30 mL 5  . buPROPion (WELLBUTRIN XL) 150 MG 24 hr tablet TAKE 1 TABLET BY MOUTH DAILY 30 tablet 5  . cetirizine (ZYRTEC) 10 MG tablet Take 10 mg by mouth daily.    . citalopram (CELEXA) 40 MG tablet TAKE 1 TABLET BY MOUTH ONCE DAILY 90 tablet 1  . meloxicam (MOBIC) 15 MG tablet take 1 tablet by mouth once daily 30 tablet 1  . verapamil (CALAN-SR) 180 MG CR tablet Take 180 mg by mouth at bedtime.    Marland Kitchen albuterol (PROVENTIL HFA;VENTOLIN HFA) 108 (90 BASE) MCG/ACT inhaler Inhale 2 puffs into the lungs every 6 (six) hours as needed for wheezing or shortness of breath. (Patient not taking: Reported on 06/12/2014) 1 Inhaler 0  . nitroGLYCERIN (NITROSTAT) 0.4 MG SL tablet Place 0.4 mg under the tongue every 5 (five) minutes as needed. For chest pain     No current facility-administered medications on file prior to visit.  Allergies  Allergen Reactions  . Anectine [Succinylcholine Chloride]     "FAMILY ALLERGY"  . Shellfish Allergy Other (See Comments)    Unknown...dr as child said to never eat   ROS: no fevers, chills, headaches, dizziness, chest pain, shortness of breath, URI or allergy symptoms. Asthma is not flaring. No heartburn, nausea, vomiting, bowel changes, urinary complaints, bleeding, bruising, rash.  Moods are stable, just frustrated about her chronic pain and weight gain.  See HPI  PHYSICAL EXAM: BP 116/70 mmHg  Pulse 64  Ht 5\' 4"  (1.626 m)  Wt 213 lb 12.8 oz (96.979 kg)  BMI  36.68 kg/m2 Well developed, pleasant, obese female in no acute distress HEENT: PERRL, EOMI, conjunctiva clear Neck: no lymphadenopathy, thyromegaly or mass Heart: regular rate and rhythm without murmur Lungs: clear bilaterally Musculosketal:  Tender in neck muscles diffusely (paraspinous, trapezius). Tender in lumbar paraspinous muscles, SI joints, sciatic notch--diffusely, jumping in pain.  Many trigger points throughout her back, and lower extremities. She is tender to palpation over both trochanteric bursa, but also jumps with pain when palpated along ITB, and any deep pressure to her skin on her legs Chest: nontender to palpation, no trigger points. Extremities: no edema, normal pulses Psych: normal mood, affect, hygiene and grooming Neuro: alert and oriented.  Cranial nerves intact. Normal strength, sensation, gait  ASSESSMENT/PLAN:  Fibromyalgia - flaring; not on any fibro medications. Start Lyrica. Consider PT - Plan: pregabalin (LYRICA) 50 MG capsule, methylPREDNISolone acetate (DEPO-MEDROL) injection 80 mg  Bilateral hip pain - related more to FM, can't r/o ITB and bursitis as well. Trial of depo-Medrol to help with pain/inflammation - Plan: methylPREDNISolone acetate (DEPO-MEDROL) injection 80 mg  Obesity (BMI 30-39.9) - wt loss meds didn't seem effective; not safe for long-term use. pt requesting add'l month to "jumpstart" wt loss. given with hesitation. Address more at [facility]  Coronary artery disease involving native coronary artery of native heart without angina pectoris - asymptomatic/stable. continue meds per cardiologist  Exercise induced LBBB  Anxiety state - stable on current regimen; requires daily xanax. refilled  OSA (obstructive sleep apnea) - discussed newer mask options (nasal) and oral appliances if unwilling to re-try CPAP.  Discuss again at [facility]. risks reviewed.   Consider taking Tylenol Arthritis as needed for pain, especially in the evening. Start Lyrica 50mg   at bedtime for a couple of nights, and if no problems, increase to twice daily for a few days, and if tolerating without significant daytime drowsiness, increase to 1 capsule three times daily.  After 1-2 weeks, you can further increase to 2 capsules at bedtime.  When we see you at your physical we can further titrate up, depending on how you are doing.  I usually prescribe 75mg  twice daily, and gradually increase, but we only have 50mg  samples (so three times daily is the same total daily dose).  We are giving you a steroid injection today that should help with any acute inflammation that might be contributing to your pain.,  Do the hip and iliotibial band stretches as shown.  If you would like referral back to [facility], let me know (not sure if they take your current insurance or not, may need to call).  Labs at [facility] in June (pt declined today) (for Mobic, taking chronically) Discussed Lyrica, risks/side effects  45 min visit, more than 1/2 spent counseling

## 2014-06-12 NOTE — Telephone Encounter (Signed)
Phoned in.

## 2014-06-12 NOTE — Telephone Encounter (Signed)
Ok to refill #90.   

## 2014-06-13 ENCOUNTER — Other Ambulatory Visit: Payer: Self-pay | Admitting: Family Medicine

## 2014-06-13 DIAGNOSIS — G4733 Obstructive sleep apnea (adult) (pediatric): Secondary | ICD-10-CM | POA: Insufficient documentation

## 2014-06-13 NOTE — Telephone Encounter (Signed)
done

## 2014-06-13 NOTE — Telephone Encounter (Signed)
Is okay to refill the Mobic?

## 2014-06-14 ENCOUNTER — Encounter: Payer: Self-pay | Admitting: Family Medicine

## 2014-06-28 ENCOUNTER — Encounter: Payer: Self-pay | Admitting: Family Medicine

## 2014-06-28 ENCOUNTER — Ambulatory Visit (INDEPENDENT_AMBULATORY_CARE_PROVIDER_SITE_OTHER): Payer: 59 | Admitting: Family Medicine

## 2014-06-28 VITALS — BP 150/84 | HR 68 | Ht 64.5 in | Wt 209.0 lb

## 2014-06-28 DIAGNOSIS — Z0000 Encounter for general adult medical examination without abnormal findings: Secondary | ICD-10-CM | POA: Diagnosis not present

## 2014-06-28 DIAGNOSIS — E559 Vitamin D deficiency, unspecified: Secondary | ICD-10-CM

## 2014-06-28 DIAGNOSIS — Z780 Asymptomatic menopausal state: Secondary | ICD-10-CM

## 2014-06-28 DIAGNOSIS — R5383 Other fatigue: Secondary | ICD-10-CM

## 2014-06-28 DIAGNOSIS — Z853 Personal history of malignant neoplasm of breast: Secondary | ICD-10-CM | POA: Diagnosis not present

## 2014-06-28 DIAGNOSIS — Z8262 Family history of osteoporosis: Secondary | ICD-10-CM

## 2014-06-28 DIAGNOSIS — Z23 Encounter for immunization: Secondary | ICD-10-CM | POA: Diagnosis not present

## 2014-06-28 DIAGNOSIS — R030 Elevated blood-pressure reading, without diagnosis of hypertension: Secondary | ICD-10-CM | POA: Diagnosis not present

## 2014-06-28 DIAGNOSIS — IMO0001 Reserved for inherently not codable concepts without codable children: Secondary | ICD-10-CM

## 2014-06-28 LAB — CBC WITH DIFFERENTIAL/PLATELET
Basophils Absolute: 0.1 10*3/uL (ref 0.0–0.1)
Basophils Relative: 1 % (ref 0–1)
EOS PCT: 3 % (ref 0–5)
Eosinophils Absolute: 0.2 10*3/uL (ref 0.0–0.7)
HEMATOCRIT: 39.3 % (ref 36.0–46.0)
HEMOGLOBIN: 13 g/dL (ref 12.0–15.0)
LYMPHS ABS: 1.8 10*3/uL (ref 0.7–4.0)
Lymphocytes Relative: 32 % (ref 12–46)
MCH: 28.6 pg (ref 26.0–34.0)
MCHC: 33.1 g/dL (ref 30.0–36.0)
MCV: 86.6 fL (ref 78.0–100.0)
MONO ABS: 0.3 10*3/uL (ref 0.1–1.0)
MONOS PCT: 6 % (ref 3–12)
MPV: 9.2 fL (ref 8.6–12.4)
Neutro Abs: 3.3 10*3/uL (ref 1.7–7.7)
Neutrophils Relative %: 58 % (ref 43–77)
PLATELETS: 222 10*3/uL (ref 150–400)
RBC: 4.54 MIL/uL (ref 3.87–5.11)
RDW: 14.8 % (ref 11.5–15.5)
WBC: 5.7 10*3/uL (ref 4.0–10.5)

## 2014-06-28 LAB — COMPREHENSIVE METABOLIC PANEL
ALT: 22 U/L (ref 0–35)
AST: 25 U/L (ref 0–37)
Albumin: 4.2 g/dL (ref 3.5–5.2)
Alkaline Phosphatase: 61 U/L (ref 39–117)
BUN: 24 mg/dL — ABNORMAL HIGH (ref 6–23)
CALCIUM: 9.2 mg/dL (ref 8.4–10.5)
CO2: 26 meq/L (ref 19–32)
CREATININE: 1.21 mg/dL — AB (ref 0.50–1.10)
Chloride: 103 mEq/L (ref 96–112)
Glucose, Bld: 91 mg/dL (ref 70–99)
Potassium: 5.5 mEq/L — ABNORMAL HIGH (ref 3.5–5.3)
Sodium: 139 mEq/L (ref 135–145)
Total Bilirubin: 0.3 mg/dL (ref 0.2–1.2)
Total Protein: 7 g/dL (ref 6.0–8.3)

## 2014-06-28 LAB — LIPID PANEL
CHOL/HDL RATIO: 3.2 ratio
Cholesterol: 157 mg/dL (ref 0–200)
HDL: 49 mg/dL (ref >=46)
LDL Cholesterol: 94 mg/dL (ref 0–99)
TRIGLYCERIDES: 68 mg/dL (ref <150)
VLDL: 14 mg/dL (ref 0–40)

## 2014-06-28 LAB — POCT URINALYSIS DIPSTICK
BILIRUBIN UA: NEGATIVE
Blood, UA: NEGATIVE
GLUCOSE UA: NEGATIVE
KETONES UA: NEGATIVE
Leukocytes, UA: NEGATIVE
NITRITE UA: NEGATIVE
Protein, UA: NEGATIVE
SPEC GRAV UA: 1.02
UROBILINOGEN UA: NEGATIVE
pH, UA: 6

## 2014-06-28 LAB — TSH: TSH: 2.541 u[IU]/mL (ref 0.350–4.500)

## 2014-06-28 NOTE — Progress Notes (Signed)
Chief Complaint  Patient presents with  . Annual Exam    fasting annual exam with pelvic, no complaints. Did not do eye exam as she recently had one at [facility].    Allison Valencia is a 59 y.o. female who presents for a complete physical.  She has the following concerns:  Fibromyalgia:  She was started on Lyrica 2 weeks ago for significant fibromyalgia flare.  She was also given depo-medrol injection at that time.  She did have some improvement within a day of last visit. She has been taking tylenol arthritis, which has helped (takes it before bed, if she doesn't, hips hurt).  Lyrica makes her sleepy during the day, requiring a nap x 1 hr.  Her level of pain is much improved, and her ability to move is much better--not in pain with walking, not stooped over  Obesity:  She is down 5# since her last visit, since restarting weight loss medication.  Breast Cancer--she is s/p extended partial mastectomy on the right (age 49), followed by bilateral mastectomy (with LN excision on the left, right already removed).  No longer gets mammograms, or sees anybody.  She states she only saw oncologist once, stating that chemo wasn't recommended due to her receptors.   There is no immunization history on file for this patient.  Doesn't recall last tetanus Only had a flu shot when her daughter had a newborn, doesn't yet yearly Last Pap smear: had hysterectomy Last mammogram: 10/2008 (prior to her mastectomy) Last colonoscopy: [facility]--she got notification 1-2 years ago that she was due (+h/o polyps and +Fhx) Last DEXA: never Dentist: 4-5 years ago ("petrified"), used to do sedation dentistry but too expensive Ophtho: 01/2014  Exercise: limited by fibromyalgia pain (and palpitations--exercise-induced BBB)  Past Medical History  Diagnosis Date  . Breast cancer age 52, recurrent 2010  . Allergy   . Anxiety   . Depression   . OSA (obstructive sleep apnea)     sleep study 6/09 (not on treatment;  pt refuses CPAP)  . Fibromyalgia 2/07  . Hypertension 2006  . Viral meningitis 5/99  . Tricuspid regurgitation 6/02    mild  . Breast cancer   . Asthma     Past Surgical History  Procedure Laterality Date  . Breast surgery      extended partial R mastectomy with LND age 89; bilateral mastectomy 11/2008, multiple reconstructive surgeries 2011-2012  . Abdominal hysterectomy  03/2009    and BSO; Dr. Raphael Gibney  . Mastectomy    . Vesicovaginal fistula closure w/ tah    . Left heart catheterization with coronary angiogram N/A 07/24/2011    Procedure: LEFT HEART CATHETERIZATION WITH CORONARY ANGIOGRAM;  Surgeon: Jacolyn Reedy, MD;  Location: [facility] CATH LAB;  Service: Cardiovascular;  Laterality: N/A;    History   Social History  . Marital Status: Married    Spouse Name: N/A  . Number of Children: N/A  . Years of Education: N/A   Occupational History  . Not on file.   Social History Main Topics  . Smoking status: Never Smoker   . Smokeless tobacco: Never Used  . Alcohol Use: Yes     Comment: 1-2 drinks once or twice a month.  . Drug Use: No  . Sexual Activity: Not on file   Other Topics Concern  . Not on file   Social History Narrative   Lives with husband, has 2 daughters--1 in in [place] with 2 grandkids.  Husband is an  alcoholic, in [employer].  Tiffany also lives at home. She has bipolar disorder    Family History  Problem Relation Age of Onset  . Asthma Mother   . Hypertension Mother   . Thyroid disease Mother   . Arthritis Mother   . Dementia Mother   . Heart disease Father     MI  . Diabetes Father   . Bipolar disorder Daughter   . Cancer Maternal Aunt     colon  . Colon cancer Maternal Aunt     >60  . Cancer Paternal Aunt     breast cancer  . Breast cancer Paternal Aunt 65  . Cancer Maternal Aunt     colon  . Colon cancer Maternal Aunt     >60  . Healthy Daughter     Outpatient Encounter Prescriptions as of 06/28/2014  Medication Sig Note  .  Acetaminophen (TYLENOL ARTHRITIS PAIN PO) Take 2 tablets by mouth 2 (two) times daily.   Marland Kitchen ALPRAZolam (XANAX) 1 MG tablet take 1 tablet by mouth 2 TO 3 TIMES A DAY if needed anxiety   . buPROPion (WELLBUTRIN XL) 150 MG 24 hr tablet TAKE 1 TABLET BY MOUTH DAILY   . cetirizine (ZYRTEC) 10 MG tablet Take 10 mg by mouth daily.   . citalopram (CELEXA) 40 MG tablet take 1 tablet by mouth once daily   . Diethylpropion HCl CR 75 MG TB24 Take 1 tablet (75 mg total) by mouth daily.   . meloxicam (MOBIC) 15 MG tablet TAKE 1 TABLET BY MOUTH ONCE DAILY   . pregabalin (LYRICA) 50 MG capsule Take 1 capsule (50 mg total) by mouth 3 (three) times daily.   . verapamil (CALAN-SR) 180 MG CR tablet Take 180 mg by mouth at bedtime. 06/12/2014: (for exercise-induced arrhythmia, from cardiologist)  . albuterol (PROVENTIL HFA;VENTOLIN HFA) 108 (90 BASE) MCG/ACT inhaler Inhale 2 puffs into the lungs every 6 (six) hours as needed for wheezing or shortness of breath. (Patient not taking: Reported on 06/12/2014) 06/12/2014: Last used a few months ago  . azelastine (ASTELIN) 137 MCG/SPRAY nasal spray Place 2 sprays into both nostrils 2 (two) times daily. Use in each nostril as directed (Patient not taking: Reported on 06/28/2014) 06/12/2014: Uses prn for allergies  . nitroGLYCERIN (NITROSTAT) 0.4 MG SL tablet Place 0.4 mg under the tongue every 5 (five) minutes as needed. For chest pain    No facility-administered encounter medications on file as of 06/28/2014.    Allergies  Allergen Reactions  . Anectine [Succinylcholine Chloride]     "FAMILY ALLERGY"  . Shellfish Allergy Other (See Comments)    Unknown...dr as child said to never eat   ROS:  The patient denies anorexia, fever, headaches,  vision changes, decreased hearing, ear pain, sore throat, breast concerns, chest pain, palpitations (just with exercise), dizziness, syncope, dyspnea on exertion, cough, swelling, nausea, vomiting, diarrhea, constipation, abdominal pain,  melena, hematochezia, indigestion/heartburn, hematuria, incontinence, dysuria, vaginal bleeding, discharge, odor or itch, genital lesions, joint pains, numbness, tingling, weakness, tremor, suspicious skin lesions, abnormal bleeding/bruising, or enlarged lymph nodes. Moods are controlled with her current medications. Allergies--controlled  PHYSICAL EXAM:  BP 138/78 mmHg  Pulse 68  Ht 5' 4.5" (1.638 m)  Wt 209 lb (94.802 kg)  BMI 35.33 kg/m2 150/84 on repeat by MD  General Appearance:    Alert, cooperative, no distress, appears stated age  Head:    Normocephalic, without obvious abnormality, atraumatic  Eyes:    PERRL, conjunctiva/corneas clear, EOM's intact, fundi  benign  Ears:    Normal TM's and external ear canals  Nose:   Nares normal, mucosa mildly edematous with some slightly bloody crusting noted on the right; no sinus tenderness  Throat:   Lips, mucosa, and tongue normal; teeth and gums normal  Neck:   Supple, no lymphadenopathy;  thyroid:  no   enlargement/tenderness/nodules; no carotid   bruit or JVD  Back:    Spine nontender, no curvature, ROM normal, no CVA     tenderness  Lungs:     Clear to auscultation bilaterally without wheezes, rales or     ronchi; respirations unlabored  Chest Wall:    No tenderness or deformity   Heart:    Regular rate and rhythm, S1 and S2 normal, no murmur, rub   or gallop  Breast Exam:    No tenderness. She is s/p bilateral mastectomy with reconstruction.  There are bilateral implants, and tattos, slightly raised "nipple" on the right, flat on the left.  No masses or abnormalities are noted. No axillary lymphadenopathy  Abdomen:     Soft, non-tender, nondistended, normoactive bowel sounds,    no masses, no hepatosplenomegaly. WHSS  Genitalia:    Normal external genitalia without lesions.  BUS and vagina normal;  Uterus is surgically absent.  No adnexal masses or tenderness. Pap not performed  Rectal:    Normal tone, no masses or tenderness;  guaiac negative stool  Extremities:   No clubbing, cyanosis or edema  Pulses:   2+ and symmetric all extremities  Skin:   Skin color, texture, turgor normal, no rashes or lesions  Lymph nodes:   Cervical, supraclavicular, and axillary nodes normal  Neurologic:   CNII-XII intact, normal strength, sensation and gait; reflexes 2+ and symmetric throughout          Psych:   Normal mood, affect, hygiene and grooming.    ASSESSMENT/PLAN:    Discussed monthly self breast exams and yearly mammograms--she has had bilateral mastectomy and likely no longer needs--need to review records.  Recommended at least 30 minutes of aerobic activity at least 5 days/week, weight-bearing exercise at least 2x/wk; proper sunscreen use reviewed; healthy diet, including goals of calcium and vitamin D intake and alcohol recommendations (less than or equal to 1 drink/day) reviewed; regular seatbelt use; changing batteries in smoke detectors.  Immunization recommendations discussed--Tdap given, and Prevnar (h/o asthma, cancer).  Colonoscopy recommendations reviewed--past due and needs to schedule   Fibromyalgia--continue the lyrica 62m twice daily.  Increase the bedtime dose to 1077monly if/when your pain worsens.  Not sure that this will happen, but if the steroid injection gave you significant benefit, and is wearing off (and hasn't completely resolved the problems it was intended to treat), then pain might increase over the next couple of weeks.  Check into your insurance coverage for zostavax (shingles vaccine) and schedule a nurse visit for this at your convenience. Consider getting it together with your flu shot in the fall (they need to be given the same time or a month apart).  Call [facility] (that's probably where you previously went) to set up colonoscopy (and have them double check that you are due)  Schedule a dental visit--definitely take the xanax prior to your appointment (and have someone else drive  you).  Check your blood pressures elsewhere (if it remains >135, I don't want to continue with the weight loss medication). You can get one additional refill, if your blood pressure is okay.  We want this medication to  HELP in weight loss, not cause any harm.  c-met, tsh, cbc, lipid, vitamin D  Plan to fu in 6 months (med check--FM, dep/anx), sooner prn, especially if BP remains elevated.

## 2014-06-28 NOTE — Patient Instructions (Addendum)
  HEALTH MAINTENANCE RECOMMENDATIONS:  It is recommended that you get at least 30 minutes of aerobic exercise at least 5 days/week (for weight loss, you may need as much as 60-90 minutes). This can be any activity that gets your heart rate up. This can be divided in 10-15 minute intervals if needed, but try and build up your endurance at least once a week.  Weight bearing exercise is also recommended twice weekly.  Eat a healthy diet with lots of vegetables, fruits and fiber.  "Colorful" foods have a lot of vitamins (ie green vegetables, tomatoes, red peppers, etc).  Limit sweet tea, regular sodas and alcoholic beverages, all of which has a lot of calories and sugar.  Up to 1 alcoholic drink daily may be beneficial for women (unless trying to lose weight, watch sugars).  Drink a lot of water.  Calcium recommendations are 1200-1500 mg daily (1500 mg for postmenopausal women or women without ovaries), and vitamin D 1000 IU daily.  This should be obtained from diet and/or supplements (vitamins), and calcium should not be taken all at once, but in divided doses.  Monthly self breast exams and yearly mammograms for women over the age of 56 is recommended.  Sunscreen of at least SPF 30 should be used on all sun-exposed parts of the skin when outside between the hours of 10 am and 4 pm (not just when at beach or pool, but even with exercise, golf, tennis, and yard work!)  Use a sunscreen that says "broad spectrum" so it covers both UVA and UVB rays, and make sure to reapply every 1-2 hours.  Remember to change the batteries in your smoke detectors when changing your clock times in the spring and fall.  Use your seat belt every time you are in a car, and please drive safely and not be distracted with cell phones and texting while driving.  Check into your insurance coverage for zostavax (shingles vaccine) and schedule a nurse visit for this at your convenience. Consider getting it together with your flu shot  in the fall (they need to be given the same time or a month apart).  Call [facility] (that's probably where you previously went) to set up colonoscopy (and have them double check that you are due)  Schedule a dental visit--definitely take the xanax prior to your appointment (and have someone else drive you).  Check your blood pressures elsewhere (if it remains >135, I don't want to continue with the weight loss medication). You can get one additional refill, if your blood pressure is okay.  We want this medication to HELP in weight loss, not cause any harm.

## 2014-06-29 LAB — VITAMIN D 25 HYDROXY (VIT D DEFICIENCY, FRACTURES): Vit D, 25-Hydroxy: 39 ng/mL (ref 30–100)

## 2014-07-18 ENCOUNTER — Ambulatory Visit (INDEPENDENT_AMBULATORY_CARE_PROVIDER_SITE_OTHER): Payer: 59 | Admitting: Medical

## 2014-07-18 ENCOUNTER — Encounter: Payer: Self-pay | Admitting: Medical

## 2014-07-18 ENCOUNTER — Ambulatory Visit
Admission: RE | Admit: 2014-07-18 | Discharge: 2014-07-18 | Disposition: A | Discharge status: Home / Self Care | Payer: 59 | Source: Ambulatory Visit | Attending: Medical | Admitting: Medical

## 2014-07-18 VITALS — BP 112/80 | HR 85 | Resp 15 | Wt 208.0 lb

## 2014-07-18 DIAGNOSIS — M79672 Pain in left foot: Secondary | ICD-10-CM | POA: Diagnosis not present

## 2014-07-18 DIAGNOSIS — J329 Chronic sinusitis, unspecified: Secondary | ICD-10-CM | POA: Diagnosis not present

## 2014-07-18 DIAGNOSIS — R0982 Postnasal drip: Secondary | ICD-10-CM

## 2014-07-18 DIAGNOSIS — M7742 Metatarsalgia, left foot: Secondary | ICD-10-CM

## 2014-07-18 MED ORDER — HYDROCODONE-ACETAMINOPHEN 5-325 MG PO TABS: 1.0000 | ORAL_TABLET | Freq: Four times a day (QID) | ORAL | Status: DC | PRN

## 2014-07-18 NOTE — Progress Notes (Signed)
Subjective: Here for foot pain.  Some times can walk on it with no problem, sometimes it kills her due to pain.  Feels like bony prominence in foot x 2 mo.  2 mo of pain.  Left foot, pain mostly left outside edge.   No fall, no trauma, no injury.  Exercise with swimming.  Not working.   Using nothing for this other than tylenol for generalized pain.   No other aggravating or relieving factors.  She reports sinus drainage, allergy medication not working.  Has 1 month history of sinus problems, zyrtec was working.   Clearing the throat all the time, feels drainage down back of throat, feels like a faucet.  No itchy eyes, no sneezing, no sinus pain, no fever, no nausea or vomiting, no teeth pain.  Doesn't feel like a sinus infection given prior sinuitis issues.  Past Medical History  Diagnosis Date  . Breast cancer age 1, recurrent 2010  . Allergy   . Anxiety   . Depression   . OSA (obstructive sleep apnea)     sleep study 6/09 (not on treatment; pt refuses CPAP)  . Fibromyalgia 2/07  . Hypertension 2006  . Viral meningitis 5/99  . Tricuspid regurgitation 6/02    mild  . Breast cancer   . Asthma    ROS as in subjective   Objective: BP 112/80 mmHg  Pulse 85  Resp 15  Wt 208 lb (94.348 kg)  General appearance: alert, no distress, WD/WN HEENT: normocephalic, sclerae anicteric, conjunctiva pink and moist, no sinus tenderness,TMs pearly, nares patent, no discharge or erythema, pharynx normal Oral cavity: MMM, no lesions Neck: supple, no lymphadenopathy, no thyromegaly, no masses Lungs: CTA bilaterally, no wheezes, rhonchi, or rales Left foot tender over 5th metatarsal head, but no other tenderness, no deformity, no swelling, normal ROM of toes, no pes planus.   Feet neurovascularly intact    Assessment: Encounter Diagnoses  Name Primary?  . Metatarsalgia of left foot Yes  . Foot pain, left   . Post-nasal drainage      Plan: Metatarsalgia, foot pain x 78mo, focal tenderness  5th metatarsal head. Go for xray, can use Hydrocodone for worse pain, OTC Tylenol prn for pain.   Of notes, recent creatinine elevation and Dr. Tomi Bamberger advised she avoid NSAIDs.     Post nasal drainage - change to OTC Benadryl for a week, hydrate well, use honey lemon hot water mixture.  She declines salt water gargles or nasal saline.   Call back if not improving in a week.

## 2014-07-26 ENCOUNTER — Telehealth: Payer: Self-pay | Admitting: Family Medicine

## 2014-07-26 ENCOUNTER — Other Ambulatory Visit: Payer: Self-pay | Admitting: Family Medicine

## 2014-07-26 DIAGNOSIS — M797 Fibromyalgia: Secondary | ICD-10-CM

## 2014-07-26 MED ORDER — PREGABALIN 50 MG PO CAPS: 50.0000 mg | ORAL_CAPSULE | Freq: Three times a day (TID) | ORAL | Status: DC

## 2014-07-26 NOTE — Telephone Encounter (Signed)
Ok for rx x 6 months

## 2014-07-26 NOTE — Telephone Encounter (Signed)
Pt called and stated that she was given samples of lyrica. She has now finished those and would like a rx sent in. Pt uses [facility] on [address] and can be reached at [phone].

## 2014-07-26 NOTE — Telephone Encounter (Signed)
rx sent

## 2014-07-27 ENCOUNTER — Telehealth: Payer: Self-pay | Admitting: Family Medicine

## 2014-07-27 DIAGNOSIS — M797 Fibromyalgia: Secondary | ICD-10-CM

## 2014-07-27 MED ORDER — PREGABALIN 50 MG PO CAPS: 50.0000 mg | ORAL_CAPSULE | Freq: Three times a day (TID) | ORAL | Status: DC

## 2014-07-27 NOTE — Telephone Encounter (Signed)
Initiated P.A. Lyrica

## 2014-07-27 NOTE — Telephone Encounter (Signed)
Pt states Lyrica requiring P.A. She states only been on it a month, taking samples but is out.  She takes this for fibromyalgia (M79.7) & pt has tried mobic, muscle relaxers and Flexeril.  She has [employer] ins.  Gave month of samples Lyrica 50mg   per [employer]

## 2014-07-27 NOTE — Telephone Encounter (Signed)
Message I got yesterday was to refill Lyrica. Refill for Wellbutrin was done by [place].  No request was sent to me for weight loss med.  It was last filled 5/16.  Per last OV on 6/1, I told her I would give her one additional refill on this weight loss medication, but needed to verify that her BP was okay.  If BP was >300 systolic, I didn't want her to continue with it.  I don't see any mention of whether she has checked her BP elsewhere.  I'm not in the office today, and did not see the message within the hour window she gave.  Please find out the info about BP.  If she has truly checked it, and is <511 systolic, you can call in a refill to pharmacy (she may choose to have it called to a different pharmacy ie at [facility]).

## 2014-07-27 NOTE — Telephone Encounter (Addendum)
Pt called stating that Lyrica 50mg  script still not at pharmacy yet and she is going out of town this morning. Script is set as a sample. Please correct in system. I called med in to pharmacy

## 2014-07-27 NOTE — Telephone Encounter (Signed)
i believe this goes to you

## 2014-07-27 NOTE — Telephone Encounter (Signed)
Pt states she asked for refill of Diethylpropion yesterday when she requested refill Wellbutrin, she is leaving in an hour to go to [facility] and wanted to take it with her

## 2014-07-27 NOTE — Telephone Encounter (Signed)
Done

## 2014-07-28 MED ORDER — DIETHYLPROPION HCL ER 75 MG PO TB24: 1.0000 | ORAL_TABLET | Freq: Every day | ORAL | Status: DC

## 2014-07-28 NOTE — Telephone Encounter (Signed)
Pt states she has been checking her BP, last time was 120/78, here 10 days ago was 112/80.  Called in Rx to [facility] t# [phone], will be Tuesday before Rx is in stock.  Pt informed

## 2014-07-30 NOTE — Telephone Encounter (Signed)
P.A. Approved til 07/26/16, faxed pharmacy

## 2014-08-08 ENCOUNTER — Encounter: Payer: Self-pay | Admitting: Genetic Counselor

## 2014-08-28 ENCOUNTER — Encounter: Payer: Self-pay | Admitting: *Deleted

## 2014-09-05 ENCOUNTER — Telehealth: Payer: Self-pay | Admitting: Family Medicine

## 2014-09-05 ENCOUNTER — Other Ambulatory Visit: Payer: Self-pay

## 2014-09-05 MED ORDER — ALPRAZOLAM 1 MG PO TABS: ORAL_TABLET | ORAL | Status: DC

## 2014-09-05 NOTE — Telephone Encounter (Signed)
Requesting refill on Xanax 1MG 

## 2014-09-05 NOTE — Telephone Encounter (Signed)
Ok to refill #90.   

## 2014-09-05 NOTE — Telephone Encounter (Signed)
CALLED IN 

## 2014-10-24 ENCOUNTER — Telehealth: Payer: Self-pay | Admitting: Family Medicine

## 2014-10-24 NOTE — Telephone Encounter (Signed)
Pt called and was wanting to know who to go to for her colonoscopy, i seen in the notes  To go to [facility] she was going to call and make a appt. Was also wanting to know about her bone density test, gave her the number to [facility]

## 2014-10-25 ENCOUNTER — Encounter: Payer: Self-pay | Admitting: *Deleted

## 2014-10-25 NOTE — Telephone Encounter (Signed)
Called patient and left message for her to call me back if she needed anything further as I did referral for colonoscopy this morning.

## 2014-10-31 ENCOUNTER — Telehealth: Payer: Self-pay | Admitting: Family Medicine

## 2014-10-31 NOTE — Telephone Encounter (Signed)
Pt called requesting to speak to Allison Valencia. She says she is getting ready to have a colonoscopy and has just realized that something may interfere with the procedure so she wants to discuss this with Allison Valencia. Patient does not want to go into details
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
## 2014-10-31 NOTE — Telephone Encounter (Signed)
Called patient back and she had heard a joke on TV about "smelling toast." It struck her curiosity because from time to time for a while she has had the experience whereas she randomly smells toast, and the is definitely NOT any toast being made anywhere in sight. So...she Googled it. Things kept popping up about this could be a sign of stoke, immune deficiency or even a sinus issue. She is very freaked out and wanted to talk to you about it and see what you thought. She is NOT having any other symptoms related to any of these things.

## 2014-11-14 LAB — HM COLONOSCOPY: HM COLON: NORMAL

## 2014-11-16 LAB — HM DEXA SCAN: HM Dexa Scan: NORMAL

## 2014-11-29 ENCOUNTER — Telehealth: Payer: Self-pay | Admitting: Family Medicine

## 2014-11-29 MED ORDER — ALPRAZOLAM 1 MG PO TABS: ORAL_TABLET | ORAL | Status: DC

## 2014-11-29 NOTE — Telephone Encounter (Signed)
Pt called requesting a refill for her RX xanax  Pt is out and stated she cannot sleep without them, pt can be reached at [phone] and uses [facility] on [address]

## 2014-11-29 NOTE — Telephone Encounter (Signed)
She has appt sched for 11/17. Okay to refill #30. add'l refills to be given at her visit (or longer rx, if desired).

## 2014-12-12 ENCOUNTER — Telehealth: Payer: Self-pay | Admitting: Internal Medicine

## 2014-12-12 ENCOUNTER — Encounter: Payer: Self-pay | Admitting: Cardiology

## 2014-12-12 NOTE — Progress Notes (Signed)
Patient ID: Allison Valencia, female   DOB: 10-Feb-1955, 59 y.o.   MRN: [id number]   Allison Valencia    Date of visit:  12/12/2014 DOB:  1955-03-31    Age:  59 yrs. Medical record number:  [id number]     Account number:  [id number] Primary Care Provider: KNAPP, Valencia ____________________________ CURRENT DIAGNOSES  1. Left bundle-branch block  2. Dyspnea  3. Personal history of malignant neoplasm of breast  4. Obesity  5. Dyspnea, unspecified ____________________________ ALLERGIES  Anectine, Intolerance-unknown ____________________________ MEDICATIONS  1. citalopram 40 mg tablet, 1 p.o. daily  2. nitroglycerin 0.4 mg tablet, sublingual, PRN  3. alprazolam 1 mg tablet, QHS  4. verapamil ER (SR) 180 mg tablet,extended release, 1 p.o. daily  5. Wellbutrin XL 150 mg 24 hr tablet, extended release, 1 p.o. daily ____________________________ CHIEF COMPLAINTS  Followup of Left bundle-branch block  Followup of Obesity ____________________________ HISTORY OF PRESENT ILLNESS Patient seen for cardiac followup. She has had a good year since she was previously here. She has gained weight and is really not exercising much. She still complains of dyspnea on exertion. She states that she aches all over and is limited as to what she can do because of her aching. She finds it difficult to go up stairs or do other activities because of her shortness of breath. No anginal type chest pains.  ____________________________ PAST HISTORY  Past Medical Illnesses:  anxiety, fibromyalgia, insomnia, breast cancer treated with surgery, radiation age 87, second occurence treated with surgery, depression, hypertension, obesity;  Cardiovascular Illnesses:  exercise-induced left bundle branch block;  Surgical Procedures:  tonsillectomy, tubal ligation, rt partial mastectomy, breast mastectomy rt, breast mastectomy left, hysterectomy, tram flap, Reconstructive breast surgery;  NYHA Classification:  I;  Canadian Angina  Classification:  Class 0: Asymptomatic;  Cardiology Procedures-Invasive:  cardiac cath (left) June 2013;  Cardiology Procedures-Noninvasive:  echocardiogram, treadmill June 2013, echocardiogram July 2013;  LVEF of 60% documented via echocardiogram on 07/30/2011,   ____________________________ CARDIO-PULMONARY TEST DATES EKG Date:  12/12/2014;   Cardiac Cath Date:  07/24/2011;  Echocardiography Date: 07/30/2011;  Chest Xray Date: 07/15/2011;  CT Scan Date:  07/30/2011   ____________________________ FAMILY HISTORY Brother -- Brother alive and well Father -- Father dead, Diabetes mellitus, Myocardial infarction Mother -- Mother alive and well ____________________________ SOCIAL HISTORY Alcohol Use:  socially;  Smoking:  never smoked;  Diet:  regular diet;  Lifestyle:  married and 2 children;  Exercise:  no regular exercise;  Occupation:  homemaker;  Residence:  lives with husband;   ____________________________ REVIEW OF SYSTEMS General:  weight gain Respiratory: mild dyspnea with exertion Cardiovascular:  please review HPI Abdominal: denies dyspepsia, GI bleeding, constipation, or diarrhea Musculoskeletal:  fibromyalgia  ____________________________ PHYSICAL EXAMINATION VITAL SIGNS  Blood Pressure:  124/70 Sitting, Right arm, large cuff  , 120/68 Standing, Right arm and large cuff   Pulse:  92/min. Weight:  211.00 lbs. Height:  65"BMI: 35  Constitutional:  pleasant white female, in no acute distress, moderately obese Neck:  neck supple without masses. No JVD, thyromegaly or bruits Chest:  normal symmetry, clear to auscultation. Cardiac:  regular rhythm, normal S1 and S2, No S3 or S4, no murmurs, gallops or rubs detected. Extremities & Back:  no deformities, clubbing, cyanosis, erythema or edema observed. Normal muscle strength and tone. Neurological:  no gross motor or sensory deficits noted, affect appropriate, oriented x3. ____________________________ MOST RECENT LIPID  PANEL 07/22/11  CHOL TOTL 182 mg/dl, LDL 128 calc, HDL 41 mg/dl, TRIGLYCER 70  mg/dl and CHOL/HDL 4.5 (Calc) ____________________________ IMPRESSIONS/PLAN  1. Chronic dyspnea on exertion 2. Obesity with weight gain since here 3. History of exercise-induced left bundle branch block 4. New EKG abnormalities with T wave inversions anteriorly  Recommendations:  She continues to have dyspnea. She has some new EKG changes and I would like her to have a repeat myocardial perfusion scan. Because she had a left branch block will need to be done with Lexiscan. In addition would like for her to have an echocardiogram to evaluate for wall motion abnormalities. ____________________________ TODAYS ORDERS  1. 2D, color flow, doppler: First Available  2. Return Visit: 1 year  3. 12 Lead EKG: 1 year  4. 12 Lead EKG: Today  5. 1 day Lexiscan myocardial perfusion study                       ____________________________ Cardiology Physician:  Allison Hough MD [facility]

## 2014-12-12 NOTE — Telephone Encounter (Signed)
Pt called and needs a referral for her insurance to see her cardiologist. She is there for her yearly exam.  [employer]- [id number] Dr. Lilly Cove Dx code- I44.7 Fax # [phone]   Approved and faxed over to them

## 2014-12-13 ENCOUNTER — Encounter: Payer: Self-pay | Admitting: Family Medicine

## 2014-12-13 ENCOUNTER — Ambulatory Visit (INDEPENDENT_AMBULATORY_CARE_PROVIDER_SITE_OTHER): Payer: 59 | Admitting: Family Medicine

## 2014-12-13 VITALS — BP 130/80 | HR 76 | Temp 98.9°F | Ht 64.5 in | Wt 207.2 lb

## 2014-12-13 DIAGNOSIS — F411 Generalized anxiety disorder: Secondary | ICD-10-CM

## 2014-12-13 DIAGNOSIS — J309 Allergic rhinitis, unspecified: Secondary | ICD-10-CM | POA: Diagnosis not present

## 2014-12-13 DIAGNOSIS — J0100 Acute maxillary sinusitis, unspecified: Secondary | ICD-10-CM

## 2014-12-13 DIAGNOSIS — Z5181 Encounter for therapeutic drug level monitoring: Secondary | ICD-10-CM | POA: Diagnosis not present

## 2014-12-13 DIAGNOSIS — R0609 Other forms of dyspnea: Secondary | ICD-10-CM | POA: Diagnosis not present

## 2014-12-13 DIAGNOSIS — M545 Low back pain: Secondary | ICD-10-CM

## 2014-12-13 DIAGNOSIS — Z23 Encounter for immunization: Secondary | ICD-10-CM | POA: Diagnosis not present

## 2014-12-13 DIAGNOSIS — E875 Hyperkalemia: Secondary | ICD-10-CM | POA: Diagnosis not present

## 2014-12-13 DIAGNOSIS — M797 Fibromyalgia: Secondary | ICD-10-CM

## 2014-12-13 DIAGNOSIS — M6248 Contracture of muscle, other site: Secondary | ICD-10-CM | POA: Diagnosis not present

## 2014-12-13 DIAGNOSIS — M62838 Other muscle spasm: Secondary | ICD-10-CM

## 2014-12-13 LAB — COMPREHENSIVE METABOLIC PANEL
ALK PHOS: 72 U/L (ref 33–130)
ALT: 17 U/L (ref 6–29)
AST: 22 U/L (ref 10–35)
Albumin: 4.2 g/dL (ref 3.6–5.1)
BUN: 14 mg/dL (ref 7–25)
CO2: 26 mmol/L (ref 20–31)
CREATININE: 0.92 mg/dL (ref 0.50–0.99)
Calcium: 9.1 mg/dL (ref 8.6–10.4)
Chloride: 102 mmol/L (ref 98–110)
Glucose, Bld: 75 mg/dL (ref 65–99)
Potassium: 4.7 mmol/L (ref 3.5–5.3)
SODIUM: 139 mmol/L (ref 135–146)
TOTAL PROTEIN: 7.3 g/dL (ref 6.1–8.1)
Total Bilirubin: 0.5 mg/dL (ref 0.2–1.2)

## 2014-12-13 LAB — POCT URINALYSIS DIPSTICK
Bilirubin, UA: NEGATIVE
Blood, UA: NEGATIVE
GLUCOSE UA: NEGATIVE
Ketones, UA: NEGATIVE
Leukocytes, UA: NEGATIVE
Nitrite, UA: NEGATIVE
PROTEIN UA: NEGATIVE
Spec Grav, UA: 1.02
Urobilinogen, UA: NEGATIVE
pH, UA: 6

## 2014-12-13 MED ORDER — DULOXETINE HCL 30 MG PO CPEP: ORAL_CAPSULE | ORAL | Status: DC

## 2014-12-13 MED ORDER — AZITHROMYCIN 250 MG PO TABS: ORAL_TABLET | ORAL | Status: DC

## 2014-12-13 MED ORDER — METAXALONE 800 MG PO TABS: 800.0000 mg | ORAL_TABLET | Freq: Four times a day (QID) | ORAL | Status: DC

## 2014-12-13 MED ORDER — NITROGLYCERIN 0.4 MG SL SUBL: 0.4000 mg | SUBLINGUAL_TABLET | SUBLINGUAL | Status: AC | PRN

## 2014-12-13 NOTE — Patient Instructions (Signed)
Drink plenty of water. Stop the Dayquil and instead use pseudoephedrine (behind the counter at the pharmacy) and Mucinex.  Use the plain mucinex since you aren't coughing much.  If your cough gets worse, add Delsym syrup.  Take the Zpak as directed--remember that you only take it for 5 days, but it works for 10. If you aren't 100% better by the 10th day, call for a refill (assuming that you are partly better).  If you are WORSE by 5-7 days, call to have the antibiotic changed.  You may continue the Zyrtec daily, as you might also have a component of allergies. Consider doing sinus rinses to help with the sinus pain. If the discolored mucus resolves with the antibiotics, but you have persistent sinus pain and clear mucus, consider adding Flonase to your allergy regimen.  I am refilling your nitroglycerin to have on hand in case you develop significant cardiac symptoms (chest pain, shortness of breath with exertion). I think it is probably fine to wait the month for the appointment for the stress test, but do not exert yourself beyond your normal routine (don't start a new exercise regimen, exercise at altitude, etc).  You may continue your regular level of activity. If you are uncomfortable this, see if they have a cancellation, or we can send you to a different cardiologist who can do it sooner, but medically speaking, it is likely fine to wait. The echocardiogram will also give you some reassurance (if it shows significant abnormalities, they likely will not wait for further evaluation).  If/when you are ready to try cymbalta for treatment of pain/fibromyalgia, we need to taper down on the citapram, as we introduce it.  You shouldn't take both together  Decrease the citalopram to 1/2 tablet for a week, then add 30mg  of cymbalta and take both together for a week.  Then increase the cymbalta to 60mg  daily (can take both together) and stop the citalopram, leaving you just on 60mg  of cymbalta  alone.  Try the muscle relaxant skelaxin (along with heat, stretches and massage) to see if that helps with your right sided neck pain.
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
## 2014-12-14 ENCOUNTER — Ambulatory Visit: Payer: Self-pay | Admitting: Family Medicine

## 2014-12-28 ENCOUNTER — Other Ambulatory Visit: Payer: Self-pay | Admitting: Family Medicine

## 2014-12-28 NOTE — Telephone Encounter (Signed)
Is this okay to call in? 

## 2014-12-28 NOTE — Telephone Encounter (Signed)
Okay to refill #90, no refill 

## 2014-12-29 ENCOUNTER — Telehealth: Payer: Self-pay | Admitting: Family Medicine

## 2015-01-03 ENCOUNTER — Telehealth: Payer: Self-pay | Admitting: Family Medicine

## 2015-01-03 NOTE — Telephone Encounter (Signed)
P.A. Approved til 12/29/15, faxed pharmacy,  Called pt and informed and she states she is not going to take it at this time, she is taking the muscle relaxer and it's working good

## 2015-01-03 NOTE — Telephone Encounter (Signed)
Last note reviewed.  Also noted that she did not decide to change from citalopram to cymbalta, as the skelaxin was helping. She was supposed to have had echo (and is also scheduled for stress test) with Dr. Wynonia Lawman.  No results are in system.  Can we get the results so I can verify that everything was normal before [place] writing a certification of her good health?  Thanks

## 2015-01-03 NOTE — Telephone Encounter (Signed)
Pt requesting a medical certificate/note stating that she is in good health. She is going to [place] on a work visa so will be living in [place] some and part of the requirement is for her to get a medical certificate stating that she is in good health. Pt's date of birth, and date of issue of the certificate/note must be on it.

## 2015-01-04 NOTE — Telephone Encounter (Signed)
Spoke with patient and she had echo done a couple of weeks ago. She was actually at Dr.Tilley's office when I called her having the stress test done. She will ask Juliann Pulse at Dr.Tilley's office to fax [place] both once stress test is completed. I told her I would follow up and call Dr.Tilley's office Monday if we hadn't received anything. Just an FYI.

## 2015-01-05 ENCOUNTER — Other Ambulatory Visit: Payer: Self-pay | Admitting: Family Medicine

## 2015-01-05 ENCOUNTER — Encounter: Payer: Self-pay | Admitting: Family Medicine

## 2015-01-05 ENCOUNTER — Telehealth: Payer: Self-pay | Admitting: Family Medicine

## 2015-01-05 NOTE — Telephone Encounter (Signed)
Pt called & states she needs clearance note today,  to be sent in for her passport.  I advised we hadn't recv'd Dr. Thurman Coyer note yet.  I called Juliann Pulse at Dr. Thurman Coyer office and asked her to fax notes, states pt is to return to their office next Friday.  I recv'd faxed note and Tammy is scanning in now so Dr. Tomi Bamberger can review. Dr. Tomi Bamberger please advise as pt needs note today.

## 2015-01-05 NOTE — Telephone Encounter (Signed)
I spoke with patient. Reviewed abnormal stress test--she had heard from Dr. Wynonia Lawman last night.  She is going OOT for 5 days to look for housing in [place]. She was advised to start 81mg  of aspirin daily, and to f/u with Dr. Wynonia Lawman for cardiac cath, as recommended. She is okay to go--reviewed symptoms for which she should seek medical care, but as long as she doesn't exert herself, she will likely be fine.  Please write letter stating the following:  Allison Valencia has been under my medical care for over ten years, and is certified to be in good health.  Thanks--I'll call the office with this info now as well. She will be picking up the letter this afternoon, so please have it ready by 1:30.  Thanks!

## 2015-01-05 NOTE — Telephone Encounter (Signed)
Done. (she elected to stay on citalopram, and not switch to cymbalta)

## 2015-01-05 NOTE — Telephone Encounter (Signed)
Is this okay to refill? 

## 2015-01-05 NOTE — Telephone Encounter (Signed)
Letter ready for pick up

## 2015-01-12 ENCOUNTER — Encounter: Payer: Self-pay | Admitting: Cardiology

## 2015-01-12 ENCOUNTER — Other Ambulatory Visit: Payer: Self-pay | Admitting: Cardiology

## 2015-01-12 ENCOUNTER — Ambulatory Visit
Admission: RE | Admit: 2015-01-12 | Discharge: 2015-01-12 | Disposition: A | Discharge status: Home / Self Care | Payer: 59 | Source: Ambulatory Visit | Attending: Cardiology | Admitting: Cardiology

## 2015-01-12 DIAGNOSIS — R062 Wheezing: Secondary | ICD-10-CM

## 2015-01-12 NOTE — Progress Notes (Signed)
Patient ID: Allison Valencia, female   DOB: July 08, 1955, 59 y.o.   MRN: [id number]   Allison Valencia    Date of visit:  01/12/2015 DOB:  1955/03/04    Age:  59 yrs. Medical record number:  [id number]     Account number:  [id number] Primary Care Provider: KNAPP, EVE ____________________________ CURRENT DIAGNOSES  1. Left bundle-branch block  2. Dyspnea  3. Obesity  4. Personal history of malignant neoplasm of breast ____________________________ ALLERGIES  Anectine, Intolerance-unknown ____________________________ MEDICATIONS  1. citalopram 40 mg tablet, 1 p.o. daily  2. nitroglycerin 0.4 mg tablet, sublingual, PRN  3. alprazolam 1 mg tablet, QHS  4. Wellbutrin XL 150 mg 24 hr tablet, extended release, 1 p.o. daily  5. verapamil ER (SR) 180 mg tablet,extended release, 1 p.o. daily ____________________________ CHIEF COMPLAINTS  Followup of Dyspnea, unspecified ____________________________ HISTORY OF PRESENT ILLNESS Patient returns for cardiac followup. She had a myocardial perfusion scan that showed a small focus of mid to distal anteroseptal ischemia. She has a known left bundle branch block which is rate needed. She has had progressive dyspnea on exertion and finds it difficult to do most any level of exertion such as walking up stairs or activity now. She doesn't really have ischemic type chest pain. At catheterization 3-1/2 years ago she had calcification noted in her proximal LAD with scattered irregularities but no severe obstructive stenoses noted. I had recommended cardiac catheterization to her in light of the abnormal myocardial perfusion scan and the change in her symptoms. Her recent echocardiogram showed LVH with normal LV systolic function. ____________________________ PAST HISTORY  Past Medical Illnesses:  anxiety, fibromyalgia, insomnia, breast cancer treated with surgery, radiation age 36, second occurence treated with surgery, depression, hypertension, obesity;  Cardiovascular  Illnesses:  exercise-induced left bundle branch block;  Surgical Procedures:  tonsillectomy, tubal ligation, rt partial mastectomy, breast mastectomy rt, breast mastectomy left, hysterectomy, tram flap, Reconstructive breast surgery;  NYHA Classification:  I;  Canadian Angina Classification:  Class 0: Asymptomatic;  Cardiology Procedures-Invasive:  cardiac cath (left) June 2013;  Cardiology Procedures-Noninvasive:  echocardiogram November 2016;  LVEF of % documented via nuclear study on 01/04/2015,   ____________________________ [name] TEST DATES EKG Date:  12/12/2014;   Cardiac Cath Date:  07/24/2011;  Nuclear Study Date:  01/04/2015;  Echocardiography Date: 07/30/2011;  Chest Xray Date: 07/15/2011;  CT Scan Date:  07/30/2011   ____________________________ FAMILY HISTORY Brother -- Brother alive and well Father -- Father dead, Diabetes mellitus, Myocardial infarction Mother -- Mother alive and well ____________________________ SOCIAL HISTORY Alcohol Use:  socially;  Smoking:  never smoked;  Diet:  regular diet;  Lifestyle:  married and 2 children;  Exercise:  no regular exercise;  Occupation:  homemaker;  Residence:  lives with husband;   ____________________________ REVIEW OF SYSTEMS General:  weight gain  Integumentary:no rashes or new skin lesions. Eyes: denies diplopia, history of glaucoma or visual problems. Respiratory: dyspnea with exertion Cardiovascular:  please review HPI Abdominal: denies dyspepsia, GI bleeding, constipation, or diarrhea Musculoskeletal:  fibromyalgia  ____________________________ PHYSICAL EXAMINATION VITAL SIGNS  Blood Pressure:  138/80 Sitting, Right arm, large cuff  , 138/84 Standing, Right arm and large cuff   Pulse:  82/min. Weight:  205.00 lbs. Height:  65"BMI: 34  Constitutional:  pleasant white female, in no acute distress, moderately obese Skin:  warm and dry to touch, no apparent skin lesions, or masses noted. Head:  normocephalic, normal hair  pattern, no masses or tenderness ENT:  ears, nose and throat reveal no  gross abnormalities.  Dentition good. Neck:  neck supple without masses. No JVD, thyromegaly or bruits Chest:  normal symmetry, clear to auscultation. Cardiac:  regular rhythm, normal S1 and S2, No S3 or S4, no murmurs, gallops or rubs detected. Abdomen:  abdomen soft,non-tender, no masses, no hepatospenomegaly, or aneurysm noted Peripheral Pulses:  the femoral,dorsalis pedis, and posterior tibial pulses are full and equal bilaterally with no bruits auscultated. Extremities & Back:  no deformities, clubbing, cyanosis, erythema or edema observed. Normal muscle strength and tone. Neurological:  no gross motor or sensory deficits noted, affect appropriate, oriented x3. ____________________________ MOST RECENT LIPID PANEL 07/22/11  CHOL TOTL 182 mg/dl, LDL 128 calc, HDL 41 mg/dl, TRIGLYCER 70 mg/dl and CHOL/HDL 4.5 (Calc) ____________________________ IMPRESSIONS/PLAN  1. Abnormal myocardial perfusion scan with progressive exertional dyspnea 2. Intermittent left bundle branch block 3. Abnormal baseline EKG 4. Obesity 5. History of breast cancer treatment with radiation  Recommendations:  Have recommended repeat catheterization to her. She has an abnormal myocardial perfusion scan. We discussed having the catheterization done through a radial approach. I have asked colleagues from the [facility] to do this and if intervention is needed to do stenting. Cardiac catheterization was discussed with the patient including risks of myocardial infarction, death, stroke, bleeding, arrhythmia, dye allergy, or renal insufficiency. He understands and is willing to proceed. Possibility of percutaneous intervention at the same setting was also discussed with the patient including risks.  ____________________________ TODAYS ORDERS  1. Comprehensive Metabolic Panel: Today  2. Complete Blood Count: Today  3. Draw PT/INR:  Today  4. PTT: Today  5. Chest X-ray PA/Lat: today  6. Radial heart cath [facility]                       ____________________________ Cardiology Physician:  Kerry Hough MD [facility]

## 2015-01-15 ENCOUNTER — Telehealth: Payer: Self-pay | Admitting: Family Medicine

## 2015-01-15 MED ORDER — BUPROPION HCL ER (XL) 150 MG PO TB24: 150.0000 mg | ORAL_TABLET | Freq: Every day | ORAL | Status: DC

## 2015-01-15 MED ORDER — METAXALONE 800 MG PO TABS: 800.0000 mg | ORAL_TABLET | Freq: Two times a day (BID) | ORAL | Status: DC | PRN

## 2015-01-15 NOTE — Telephone Encounter (Signed)
Pt called and got a new insurance policy and they want her RX sent to [place] wants her to have 90 day supplies of all of her refills, the inurance is in effect and the group id number is [id number] and member id is [id number] the pt is going out of town on Jan the 4th and is worried tht RX want be there before she leaves, [facility] and isnt for sure how they work. Pt can be reached at [phone] and pt is having a heart cath on wed just wanted to let you know she said please call her with any questions,

## 2015-01-15 NOTE — Telephone Encounter (Signed)
I called patient back and she specifically is asking for 1)bupropion 2)skelaxin 3)alprazolam 4) citalopram be sent to new pharmacy, [facility] for a 90 day supply, is this okay?

## 2015-01-15 NOTE — Telephone Encounter (Signed)
Only truly needs the skelaxin and bupropion right now. Will send.

## 2015-01-15 NOTE — Telephone Encounter (Signed)
She was just given #90 of alprazolam earlier this month, and had a 90 d supply of citalopram also sent earlier this month.  (skelaxin was only done for #30). Okay for  #90 of skelaxin, buproprion; just not sure the other two truly need 90 d supplies right now, as she should have filled these within the last 2 weeks.

## 2015-01-16 ENCOUNTER — Telehealth: Payer: Self-pay | Admitting: Interventional Cardiology

## 2015-01-16 NOTE — Telephone Encounter (Signed)
New message     Patient calling for instruction on upcoming cath procedure that schedule on  12.21.2016

## 2015-01-16 NOTE — Telephone Encounter (Signed)
Returned pt call. Pt is a pt of Dr.Tilley, her cardiac cath is scheduled with Dr.Smith on 12/21 @ 10am. Pt adv No Po after midnight. Adv pt she does not need to hold any medications, ok to take morning meds with just enough water to get them down. Pt adv to report to [facility] @8am  for her 10am procedure. Pt voiced appreciation for the call back and verbalized understanding.

## 2015-01-17 ENCOUNTER — Ambulatory Visit (HOSPITAL_COMMUNITY)
Admission: RE | Admit: 2015-01-17 | Discharge: 2015-01-17 | Disposition: A | Discharge status: Home / Self Care | Payer: 59 | Source: Ambulatory Visit | Attending: Interventional Cardiology | Admitting: Interventional Cardiology

## 2015-01-17 ENCOUNTER — Encounter (HOSPITAL_COMMUNITY): Payer: Self-pay | Admitting: Interventional Cardiology

## 2015-01-17 ENCOUNTER — Encounter (HOSPITAL_COMMUNITY)
Admission: RE | Disposition: A | Discharge status: Home / Self Care | Payer: Self-pay | Source: Ambulatory Visit | Attending: Interventional Cardiology

## 2015-01-17 DIAGNOSIS — Z8249 Family history of ischemic heart disease and other diseases of the circulatory system: Secondary | ICD-10-CM | POA: Diagnosis not present

## 2015-01-17 DIAGNOSIS — R0600 Dyspnea, unspecified: Secondary | ICD-10-CM

## 2015-01-17 DIAGNOSIS — Z6834 Body mass index (BMI) 34.0-34.9, adult: Secondary | ICD-10-CM | POA: Insufficient documentation

## 2015-01-17 DIAGNOSIS — R0609 Other forms of dyspnea: Secondary | ICD-10-CM | POA: Insufficient documentation

## 2015-01-17 DIAGNOSIS — Z853 Personal history of malignant neoplasm of breast: Secondary | ICD-10-CM | POA: Insufficient documentation

## 2015-01-17 DIAGNOSIS — F329 Major depressive disorder, single episode, unspecified: Secondary | ICD-10-CM | POA: Diagnosis not present

## 2015-01-17 DIAGNOSIS — F419 Anxiety disorder, unspecified: Secondary | ICD-10-CM | POA: Diagnosis not present

## 2015-01-17 DIAGNOSIS — I10 Essential (primary) hypertension: Secondary | ICD-10-CM | POA: Insufficient documentation

## 2015-01-17 DIAGNOSIS — M797 Fibromyalgia: Secondary | ICD-10-CM | POA: Insufficient documentation

## 2015-01-17 DIAGNOSIS — G4700 Insomnia, unspecified: Secondary | ICD-10-CM | POA: Diagnosis not present

## 2015-01-17 DIAGNOSIS — I2510 Atherosclerotic heart disease of native coronary artery without angina pectoris: Secondary | ICD-10-CM | POA: Insufficient documentation

## 2015-01-17 DIAGNOSIS — I447 Left bundle-branch block, unspecified: Secondary | ICD-10-CM | POA: Insufficient documentation

## 2015-01-17 DIAGNOSIS — E669 Obesity, unspecified: Secondary | ICD-10-CM | POA: Insufficient documentation

## 2015-01-17 DIAGNOSIS — Z923 Personal history of irradiation: Secondary | ICD-10-CM | POA: Diagnosis not present

## 2015-01-17 DIAGNOSIS — R9439 Abnormal result of other cardiovascular function study: Secondary | ICD-10-CM

## 2015-01-17 DIAGNOSIS — F411 Generalized anxiety disorder: Secondary | ICD-10-CM | POA: Diagnosis present

## 2015-01-17 HISTORY — PX: CARDIAC CATHETERIZATION: SHX172

## 2015-01-17 SURGERY — LEFT HEART CATH AND CORONARY ANGIOGRAPHY
Anesthesia: LOCAL

## 2015-01-17 MED ORDER — HEPARIN SODIUM (PORCINE) 1000 UNIT/ML IJ SOLN
INTRAMUSCULAR | Status: DC | PRN
  Administered 2015-01-17: 4500 [IU] via INTRAVENOUS

## 2015-01-17 MED ORDER — LIDOCAINE HCL (PF) 1 % IJ SOLN
INTRAMUSCULAR | Status: AC
  Filled 2015-01-17: qty 30

## 2015-01-17 MED ORDER — HYDRALAZINE HCL 20 MG/ML IJ SOLN
INTRAMUSCULAR | Status: AC
  Filled 2015-01-17: qty 1

## 2015-01-17 MED ORDER — SODIUM CHLORIDE 0.9 % IV SOLN: 250.0000 mL | INTRAVENOUS | Status: DC | PRN

## 2015-01-17 MED ORDER — SODIUM CHLORIDE 0.9 % IJ SOLN: 3.0000 mL | INTRAMUSCULAR | Status: DC | PRN

## 2015-01-17 MED ORDER — NITROGLYCERIN 1 MG/10 ML FOR IR/CATH LAB
INTRA_ARTERIAL | Status: AC
  Filled 2015-01-17: qty 10

## 2015-01-17 MED ORDER — ONDANSETRON HCL 4 MG/2ML IJ SOLN: 4.0000 mg | Freq: Four times a day (QID) | INTRAMUSCULAR | Status: DC | PRN

## 2015-01-17 MED ORDER — HEPARIN (PORCINE) IN NACL 2-0.9 UNIT/ML-% IJ SOLN
INTRAMUSCULAR | Status: AC
  Filled 2015-01-17: qty 1500

## 2015-01-17 MED ORDER — SODIUM CHLORIDE 0.9 % WEIGHT BASED INFUSION
3.0000 mL/kg/h | INTRAVENOUS | Status: AC
  Administered 2015-01-17: 3 mL/kg/h via INTRAVENOUS

## 2015-01-17 MED ORDER — SODIUM CHLORIDE 0.9 % IJ SOLN: 3.0000 mL | Freq: Two times a day (BID) | INTRAMUSCULAR | Status: DC

## 2015-01-17 MED ORDER — FENTANYL CITRATE (PF) 100 MCG/2ML IJ SOLN
INTRAMUSCULAR | Status: AC
  Filled 2015-01-17: qty 2

## 2015-01-17 MED ORDER — HEPARIN SODIUM (PORCINE) 1000 UNIT/ML IJ SOLN
INTRAMUSCULAR | Status: AC
  Filled 2015-01-17: qty 1

## 2015-01-17 MED ORDER — ACETAMINOPHEN 325 MG PO TABS: 650.0000 mg | ORAL_TABLET | ORAL | Status: DC | PRN

## 2015-01-17 MED ORDER — NITROGLYCERIN 1 MG/10 ML FOR IR/CATH LAB
INTRA_ARTERIAL | Status: DC | PRN
  Administered 2015-01-17: 11:00:00

## 2015-01-17 MED ORDER — SODIUM CHLORIDE 0.9 % WEIGHT BASED INFUSION: 3.0000 mL/kg/h | INTRAVENOUS | Status: DC

## 2015-01-17 MED ORDER — SODIUM CHLORIDE 0.9 % WEIGHT BASED INFUSION: 1.0000 mL/kg/h | INTRAVENOUS | Status: DC

## 2015-01-17 MED ORDER — HYDROMORPHONE HCL 1 MG/ML IJ SOLN
INTRAMUSCULAR | Status: AC
  Filled 2015-01-17: qty 1

## 2015-01-17 MED ORDER — HYDROMORPHONE HCL 1 MG/ML IJ SOLN
INTRAMUSCULAR | Status: DC | PRN
  Administered 2015-01-17: 0.5 mg via INTRAVENOUS

## 2015-01-17 MED ORDER — OXYCODONE-ACETAMINOPHEN 5-325 MG PO TABS: 1.0000 | ORAL_TABLET | ORAL | Status: DC | PRN

## 2015-01-17 MED ORDER — MIDAZOLAM HCL 2 MG/2ML IJ SOLN
INTRAMUSCULAR | Status: DC | PRN
  Administered 2015-01-17 (×2): 1 mg via INTRAVENOUS

## 2015-01-17 MED ORDER — FENTANYL CITRATE (PF) 100 MCG/2ML IJ SOLN
INTRAMUSCULAR | Status: DC | PRN
  Administered 2015-01-17 (×2): 50 ug via INTRAVENOUS

## 2015-01-17 MED ORDER — IOHEXOL 350 MG/ML SOLN
INTRAVENOUS | Status: DC | PRN
  Administered 2015-01-17: 75 mL via INTRACARDIAC

## 2015-01-17 MED ORDER — SODIUM CHLORIDE 0.9 % IJ SOLN
3.0000 mL | Freq: Two times a day (BID) | INTRAMUSCULAR | Status: DC
Start: 2015-01-17 — End: 2015-01-17

## 2015-01-17 MED ORDER — MIDAZOLAM HCL 2 MG/2ML IJ SOLN
INTRAMUSCULAR | Status: AC
  Filled 2015-01-17: qty 2

## 2015-01-17 MED ORDER — VERAPAMIL HCL 2.5 MG/ML IV SOLN
INTRAVENOUS | Status: AC
  Filled 2015-01-17: qty 2

## 2015-01-17 MED ORDER — ASPIRIN 81 MG PO CHEW: 81.0000 mg | CHEWABLE_TABLET | ORAL | Status: DC

## 2015-01-17 MED ORDER — VERAPAMIL HCL 2.5 MG/ML IV SOLN
INTRAVENOUS | Status: DC | PRN
  Administered 2015-01-17: 10:00:00 via INTRA_ARTERIAL

## 2015-01-17 SURGICAL SUPPLY — 9 items
CATH INFINITI 5 FR JL3.5 (CATHETERS) ×2 IMPLANT
CATH INFINITI JR4 5F (CATHETERS) ×2 IMPLANT
DEVICE RAD COMP TR BAND LRG (VASCULAR PRODUCTS) ×2 IMPLANT
GLIDESHEATH SLEND A-KIT 6F 22G (SHEATH) ×2 IMPLANT
KIT HEART LEFT (KITS) ×2 IMPLANT
PACK CARDIAC CATHETERIZATION (CUSTOM PROCEDURE TRAY) ×2 IMPLANT
TRANSDUCER W/STOPCOCK (MISCELLANEOUS) ×2 IMPLANT
TUBING CIL FLEX 10 FLL-RA (TUBING) ×2 IMPLANT
WIRE SAFE-T 1.5MM-J .035X260CM (WIRE) ×2 IMPLANT

## 2015-01-17 NOTE — Discharge Instructions (Signed)
Radial Site Care °Refer to this sheet in the next few weeks. These instructions provide you with information about caring for yourself after your procedure. Your health care provider may also give you more specific instructions. Your treatment has been planned according to current medical practices, but problems sometimes occur. Call your health care provider if you have any problems or questions after your procedure. °WHAT TO EXPECT AFTER THE PROCEDURE °After your procedure, it is typical to have the following: °· Bruising at the radial site that usually fades within 1-2 weeks. °· Blood collecting in the tissue (hematoma) that may be painful to the touch. It should usually decrease in size and tenderness within 1-2 weeks. °HOME CARE INSTRUCTIONS °· Take medicines only as directed by your health care provider. °· You may shower 24-48 hours after the procedure or as directed by your health care provider. Remove the bandage (dressing) and gently wash the site with plain soap and water. Pat the area dry with a clean towel. Do not rub the site, because this may cause bleeding. °· Do not take baths, swim, or use a hot tub until your health care provider approves. °· Check your insertion site every day for redness, swelling, or drainage. °· Do not apply powder or lotion to the site. °· Do not flex or bend the affected arm for 24 hours or as directed by your health care provider. °· Do not push or pull heavy objects with the affected arm for 24 hours or as directed by your health care provider. °· Do not lift over 10 lb (4.5 kg) for 5 days after your procedure or as directed by your health care provider. °· Ask your health care provider when it is okay to: °¨ Return to work or school. °¨ Resume usual physical activities or sports. °¨ Resume sexual activity. °· Do not drive home if you are discharged the same day as the procedure. Have someone else drive you. °· You may drive 24 hours after the procedure unless otherwise  instructed by your health care provider. °· Do not operate machinery or power tools for 24 hours after the procedure. °· If your procedure was done as an outpatient procedure, which means that you went home the same day as your procedure, a responsible adult should be with you for the first 24 hours after you arrive home. °· Keep all follow-up visits as directed by your health care provider. This is important. °SEEK MEDICAL CARE IF: °· You have a fever. °· You have chills. °· You have increased bleeding from the radial site. Hold pressure on the site. °SEEK IMMEDIATE MEDICAL CARE IF: °· You have unusual pain at the radial site. °· You have redness, warmth, or swelling at the radial site. °· You have drainage (other than a small amount of blood on the dressing) from the radial site. °· The radial site is bleeding, and the bleeding does not stop after 30 minutes of holding steady pressure on the site. °· Your arm or hand becomes pale, cool, tingly, or numb. °  °This information is not intended to replace advice given to you by your health care provider. Make sure you discuss any questions you have with your health care provider. °  °Document Released: 02/15/2010 Document Revised: 02/03/2014 Document Reviewed: 08/01/2013 °Elsevier Interactive Patient Education ©2016 Else[employer] Inc[employer] ° °

## 2015-01-17 NOTE — Interval H&P Note (Signed)
Cath Lab Visit (complete for each Cath Lab visit)  Clinical Evaluation Leading to the Procedure:   ACS: No.  Non-ACS:    Anginal Classification: CCS III  Anti-ischemic medical therapy: Minimal Therapy (1 class of medications)  Non-Invasive Test Results: Intermediate-risk stress test findings: cardiac mortality 1-3%/year  Prior CABG: No previous CABG      History and Physical Interval Note:  01/17/2015 9:27 AM  Allison Valencia  has presented today for surgery, with the diagnosis of abnormal myoview  The various methods of treatment have been discussed with the patient and family. After consideration of risks, benefits and other options for treatment, the patient has consented to  Procedure(s): Left Heart Cath and Coronary Angiography (N/A) as a surgical intervention .  The patient's history has been reviewed, patient examined, no change in status, stable for surgery.  I have reviewed the patient's chart and labs.  Questions were answered to the patient's satisfaction.     Sinclair Grooms

## 2015-01-17 NOTE — Research (Signed)
CADLAD Informed Consent   Subject Name: Allison Valencia  Subject met inclusion and exclusion criteria.  The informed consent form, study requirements and expectations were reviewed with the subject and questions and concerns were addressed prior to the signing of the consent form.  The subject verbalized understanding of the trail requirements.  The subject agreed to participate in the CADLAD trial and signed the informed consent.  The informed consent was obtained prior to performance of any protocol-specific procedures for the subject.  A copy of the signed informed consent was given to the subject and a copy was placed in the subject's medical record.  Hedrick,Tammy W 01/17/2015, [id number]

## 2015-01-17 NOTE — H&P (View-Only) (Signed)
Patient ID: Allison Valencia, female   DOB: 1955-10-20, 59 y.o.   MRN: [id number]   [name], [name]    Date of visit:  01/12/2015 DOB:  06-Jun-1955    Age:  59 yrs. Medical record number:  [id number]     Account number:  [id number] Primary Care Provider: KNAPP, EVE ____________________________ CURRENT DIAGNOSES  1. Left bundle-branch block  2. Dyspnea  3. Obesity  4. Personal history of malignant neoplasm of breast ____________________________ ALLERGIES  Anectine, Intolerance-unknown ____________________________ MEDICATIONS  1. citalopram 40 mg tablet, 1 p.o. daily  2. nitroglycerin 0.4 mg tablet, sublingual, PRN  3. alprazolam 1 mg tablet, QHS  4. Wellbutrin XL 150 mg 24 hr tablet, extended release, 1 p.o. daily  5. verapamil ER (SR) 180 mg tablet,extended release, 1 p.o. daily ____________________________ CHIEF COMPLAINTS  Followup of Dyspnea, unspecified ____________________________ HISTORY OF PRESENT ILLNESS Patient returns for cardiac followup. She had a myocardial perfusion scan that showed a small focus of mid to distal anteroseptal ischemia. She has a known left bundle branch block which is rate needed. She has had progressive dyspnea on exertion and finds it difficult to do most any level of exertion such as walking up stairs or activity now. She doesn't really have ischemic type chest pain. At catheterization 3-1/2 years ago she had calcification noted in her proximal LAD with scattered irregularities but no severe obstructive stenoses noted. I had recommended cardiac catheterization to her in light of the abnormal myocardial perfusion scan and the change in her symptoms. Her recent echocardiogram showed LVH with normal LV systolic function. ____________________________ PAST HISTORY  Past Medical Illnesses:  anxiety, fibromyalgia, insomnia, breast cancer treated with surgery, radiation age 59, second occurence treated with surgery, depression, hypertension, obesity;  Cardiovascular  Illnesses:  exercise-induced left bundle branch block;  Surgical Procedures:  tonsillectomy, tubal ligation, rt partial mastectomy, breast mastectomy rt, breast mastectomy left, hysterectomy, tram flap, Reconstructive breast surgery;  NYHA Classification:  I;  Canadian Angina Classification:  Class 0: Asymptomatic;  Cardiology Procedures-Invasive:  cardiac cath (left) June 2013;  Cardiology Procedures-Noninvasive:  echocardiogram November 2016;  LVEF of % documented via nuclear study on 01/04/2015,   ____________________________ [name] TEST DATES EKG Date:  12/12/2014;   Cardiac Cath Date:  07/24/2011;  Nuclear Study Date:  01/04/2015;  Echocardiography Date: 07/30/2011;  Chest Xray Date: 07/15/2011;  CT Scan Date:  07/30/2011   ____________________________ FAMILY HISTORY Brother -- Brother alive and well Father -- Father dead, Diabetes mellitus, Myocardial infarction Mother -- Mother alive and well ____________________________ SOCIAL HISTORY Alcohol Use:  socially;  Smoking:  never smoked;  Diet:  regular diet;  Lifestyle:  married and 2 children;  Exercise:  no regular exercise;  Occupation:  homemaker;  Residence:  lives with husband;   ____________________________ REVIEW OF SYSTEMS General:  weight gain  Integumentary:no rashes or new skin lesions. Eyes: denies diplopia, history of glaucoma or visual problems. Respiratory: dyspnea with exertion Cardiovascular:  please review HPI Abdominal: denies dyspepsia, GI bleeding, constipation, or diarrhea Musculoskeletal:  fibromyalgia  ____________________________ PHYSICAL EXAMINATION VITAL SIGNS  Blood Pressure:  138/80 Sitting, Right arm, large cuff  , 138/84 Standing, Right arm and large cuff   Pulse:  82/min. Weight:  205.00 lbs. Height:  65"BMI: 34  Constitutional:  pleasant white female, in no acute distress, moderately obese Skin:  warm and dry to touch, no apparent skin lesions, or masses noted. Head:  normocephalic, normal hair  pattern, no masses or tenderness ENT:  ears, nose and throat reveal no  gross abnormalities.  Dentition good. Neck:  neck supple without masses. No JVD, thyromegaly or bruits Chest:  normal symmetry, clear to auscultation. Cardiac:  regular rhythm, normal S1 and S2, No S3 or S4, no murmurs, gallops or rubs detected. Abdomen:  abdomen soft,non-tender, no masses, no hepatospenomegaly, or aneurysm noted Peripheral Pulses:  the femoral,dorsalis pedis, and posterior tibial pulses are full and equal bilaterally with no bruits auscultated. Extremities & Back:  no deformities, clubbing, cyanosis, erythema or edema observed. Normal muscle strength and tone. Neurological:  no gross motor or sensory deficits noted, affect appropriate, oriented x3. ____________________________ MOST RECENT LIPID PANEL 07/22/11  CHOL TOTL 182 mg/dl, LDL 128 calc, HDL 41 mg/dl, TRIGLYCER 70 mg/dl and CHOL/HDL 4.5 (Calc) ____________________________ IMPRESSIONS/PLAN  1. Abnormal myocardial perfusion scan with progressive exertional dyspnea 2. Intermittent left bundle branch block 3. Abnormal baseline EKG 4. Obesity 5. History of breast cancer treatment with radiation  Recommendations:  Have recommended repeat catheterization to her. She has an abnormal myocardial perfusion scan. We discussed having the catheterization done through a radial approach. I have asked colleagues from the [facility] to do this and if intervention is needed to do stenting. Cardiac catheterization was discussed with the patient including risks of myocardial infarction, death, stroke, bleeding, arrhythmia, dye allergy, or renal insufficiency. He understands and is willing to proceed. Possibility of percutaneous intervention at the same setting was also discussed with the patient including risks.  ____________________________ TODAYS ORDERS  1. Comprehensive Metabolic Panel: Today  2. Complete Blood Count: Today  3. Draw PT/INR:  Today  4. PTT: Today  5. Chest X-ray PA/Lat: today  6. Radial heart cath [facility]                       ____________________________ Cardiology Physician:  Kerry Hough MD [facility]

## 2015-01-24 ENCOUNTER — Telehealth: Payer: Self-pay | Admitting: Family Medicine

## 2015-01-30 ENCOUNTER — Telehealth: Payer: Self-pay | Admitting: Family Medicine

## 2015-01-30 NOTE — Telephone Encounter (Signed)
done

## 2015-01-30 NOTE — Telephone Encounter (Signed)
This may be a duplicate--did it just ask for directions?  I already completed one and should be in a red folder, stating that it should be one by mouth daily.  Please make sure the other one is located and faxed back

## 2015-01-30 NOTE — Telephone Encounter (Signed)
Rcvd rx clarification request form for Citalopram 40mg . Placed form in Dr Johnsie Kindred folder

## 2015-03-25 ENCOUNTER — Other Ambulatory Visit: Payer: Self-pay | Admitting: Family Medicine

## 2015-03-26 ENCOUNTER — Telehealth: Payer: Self-pay | Admitting: *Deleted

## 2015-03-26 MED ORDER — ALPRAZOLAM 1 MG PO TABS: 1.0000 mg | ORAL_TABLET | Freq: Three times a day (TID) | ORAL | Status: DC | PRN

## 2015-03-26 NOTE — Telephone Encounter (Signed)
She does not this medication must have been auto refill. Denied.

## 2015-03-26 NOTE — Telephone Encounter (Signed)
Ok to refill. Looks like we las filled it 12/1 for #90. Okay for #90, no refill. Also, check about the skelaxin (from rx request). Thanks

## 2015-03-26 NOTE — Telephone Encounter (Signed)
looks like she was given #90 on 12/19. Is she truly needing more than 1/d (and at least 1 daily??). I don't recall her being on this chronically, thought this was just started maybe in November or so for neck and back pain. Please touch base with the patient, see what she is taking it for, and how often.  I wouldn't recommend it as preventative, but if truly needs 1/day, okay to refill.

## 2015-03-26 NOTE — Telephone Encounter (Signed)
Phoned in.

## 2015-03-26 NOTE — Telephone Encounter (Signed)
Patient requesting refill on xanax to [facility].

## 2015-03-26 NOTE — Telephone Encounter (Signed)
Is this okay to refill? 

## 2015-06-22 ENCOUNTER — Other Ambulatory Visit: Payer: Self-pay | Admitting: Family Medicine

## 2015-06-22 MED ORDER — ALPRAZOLAM 1 MG PO TABS: 1.0000 mg | ORAL_TABLET | Freq: Three times a day (TID) | ORAL | Status: DC | PRN

## 2015-06-22 NOTE — Telephone Encounter (Signed)
done

## 2015-06-22 NOTE — Telephone Encounter (Signed)
Alprazolam was last filled #90 on 2/27, so she should be about out.  Okay to call in #30 (not 90), which should cover her until her appointment.  (will need to be [employer])

## 2015-06-22 NOTE — Telephone Encounter (Signed)
Her last CPE was 06/2014, and she has had 1 acute visit in the interim (November).  She has no scheduled med checks or physical.  Ok to refill #90, but she needs to schedule a med check.  No other refills for any meds will be given without med check in June--she needs to know this

## 2015-06-22 NOTE — Addendum Note (Signed)
Addended by: Billie Lade on: 06/22/2015 03:17 PM   Modules accepted: Orders

## 2015-06-22 NOTE — Telephone Encounter (Signed)
Is this ok to refill?  

## 2015-06-22 NOTE — Telephone Encounter (Signed)
FYI: Pt is scheduled for a med check at the end of June. Stated she will be running out of xanax soon and she has to have that for sleep. She is out of town in [place] and not sure when she will be returning home. Said that she will call and move up appt if she gets home sooner.

## 2015-07-03 ENCOUNTER — Other Ambulatory Visit: Payer: Self-pay | Admitting: Family Medicine

## 2015-07-04 ENCOUNTER — Telehealth: Payer: Self-pay | Admitting: *Deleted

## 2015-07-04 NOTE — Telephone Encounter (Signed)
Spoke with patient and she will call me back soon and reschedule.

## 2015-07-04 NOTE — Telephone Encounter (Signed)
Patient called and said that she weaned herself off of her bupropion. She had about a weeks worth of meds left and took one every other day for a week and then cut in half and took every other day. Has been off of completely for 4 days now. Wants to know if this is okay or could she possibly have a side effect from the way she weaned herself off? Also wants to speak to you about coming off of the citalopram at her next visit (which was 6/21) but she canceled when she was on the phone with me and won't be able to reschedule until at least July. (I tried, but she will be out of town from the 9th of June until the end of the month).

## 2015-07-04 NOTE — Telephone Encounter (Signed)
The way she weaned off should be okay, the question would be whether or not she has recurrent anxiety.  We had a refill request come for her alprazolam, that was just filled #30 2 weeks ago (was taking just 1 nightly per records).  If needing more frequent alprazolam, then she shouldn't be weaning off her anxiety medications. Also, we rx'd #30 only of the alprazolam recently, to make it last until her scheduled med check. Her last physical was 06/2014.  She was here once in November for acute visit since that time (sinusitis). I cannot keep prescribing controlled substances without OV's. (If she intends to be spending a significant portion of her time in [place], then she may need to find a doctor there at some point. It seems anytime she calls needing something, she is out of town)

## 2015-07-04 NOTE — Telephone Encounter (Signed)
This was just filled 5/26, for just a 30 d supply, as it was supposed to cover her until her appointment scheduled for 6/21.  This is too soon

## 2015-07-04 NOTE — Telephone Encounter (Signed)
Is this okay to refill? 

## 2015-07-05 ENCOUNTER — Ambulatory Visit (INDEPENDENT_AMBULATORY_CARE_PROVIDER_SITE_OTHER): Payer: BLUE CROSS/BLUE SHIELD | Admitting: Family Medicine

## 2015-07-05 ENCOUNTER — Encounter: Payer: Self-pay | Admitting: Family Medicine

## 2015-07-05 VITALS — BP 120/72 | HR 68 | Ht 64.5 in | Wt 211.0 lb

## 2015-07-05 DIAGNOSIS — G4700 Insomnia, unspecified: Secondary | ICD-10-CM

## 2015-07-05 DIAGNOSIS — F329 Major depressive disorder, single episode, unspecified: Secondary | ICD-10-CM

## 2015-07-05 DIAGNOSIS — F32A Depression, unspecified: Secondary | ICD-10-CM

## 2015-07-05 DIAGNOSIS — F419 Anxiety disorder, unspecified: Secondary | ICD-10-CM

## 2015-07-05 DIAGNOSIS — I447 Left bundle-branch block, unspecified: Secondary | ICD-10-CM

## 2015-07-05 DIAGNOSIS — M797 Fibromyalgia: Secondary | ICD-10-CM

## 2015-07-05 DIAGNOSIS — Z853 Personal history of malignant neoplasm of breast: Secondary | ICD-10-CM

## 2015-07-05 DIAGNOSIS — M25562 Pain in left knee: Secondary | ICD-10-CM

## 2015-07-05 MED ORDER — ALPRAZOLAM 1 MG PO TABS: 1.0000 mg | ORAL_TABLET | Freq: Three times a day (TID) | ORAL | Status: DC | PRN

## 2015-07-05 NOTE — Patient Instructions (Signed)
  Please check with your insurance about zostavax coverage (shingles vaccine). If covered and desired, you can schedule a nurse visit, or get next time you come. It needs to be 1 month separate from any other vaccine (or given same day--ie given same day as a flu shot).  Consider Belsomra for sleep i n the future (this is the medication we discussed).  In one month (after being off the wellbutrin), if doing well mood-wise, try cutting the citalopram to 1/2 pill every day.  Do this for at least 1 month. If you notice any recurrent anxiety or depression, go back up to the full tablet.  If doing well, you can further taper to either 1/2 tablet every other day, vs 1/4 tablet, vs calling for 10mg  dose.  Stay at the 10mg  for a month, then if doing well still, every other day for a week and then stop it entirely.   Try taking ibuprofen (600-800mg ) or 2 aleve prior to your long drives. If knee pain is flaring, you can take these regularly for up to 7-10 days to see if that helps resolve the problems.  2 aleve twice daily with food, or 600-800mg  if ibuprofen three times daily with food.  Don't take both--one type or the other. You can also take tylenol along with this, if needed.

## 2015-07-05 NOTE — Progress Notes (Signed)
Chief Complaint  Patient presents with  . Med check    fasting med check. Would like to discuss celexa taper, not quite ready to do right now but maybe in a month.    Fibromyalgia--"I just deal with it".  Muscle relaxers don't help, so she doesn't take them.  She doesn't take anything for pain. Previously took meloxicam ,but not in a long time, no NSAIDs. Pain is usually 6/10, sometimes flares.  "It is my new normal".  Hot baths after yardwork helps.  Breast Cancer--she is s/p extended partial mastectomy on the right (age 60), followed by bilateral mastectomy (with LN excision on the left, right already removed). No longer gets mammograms, or sees anybody. She states she only saw oncologist once, stating that chemo wasn't recommended due to her receptors. Last CPE was 1 year ago.  Anxiety/depression and insomnia: She was running out of wellbutrin, so when she had only 7 left, she took every other day, then 1/2 tablet every other day and then stopped.  She hasn't had any in 4-5 days.  She continues to take the citalopram.  Moods have been very good, and she would like to try coming off the citalopram at some point. She only uses the alprazolam for sleep, rarely (1x/year) needs for anxiety. She had nightmares from other medications, and other medications she tried were ineffective. She has gone a week without alprazolam (when she forgot to take bottle when she was out of town), and used Simply Sleep instead. This wasn't as effective as it had been in the past.  Problems with insomnia only started after her hysterectomy.   Exercise-induced LBBB: Doing well on Calan without side effects. DOE persists, does better when it is cool out.  PMH, PSH, SH reviewed. FH updated--mother with dementia (recently got lost driving, near home)  Outpatient Encounter Prescriptions as of 07/05/2015  Medication Sig Note  . ALPRAZolam (XANAX) 1 MG tablet Take 1 tablet (1 mg total) by mouth 3 (three) times daily as  needed for anxiety or sleep.   . citalopram (CELEXA) 40 MG tablet Take 40 mg by mouth daily.   . verapamil (CALAN-SR) 240 MG CR tablet Take 240 mg by mouth daily.  07/05/2015: Received from: External Pharmacy  . [DISCONTINUED] ALPRAZolam (XANAX) 1 MG tablet Take by mouth. 07/05/2015: Received from: [facility]  . [DISCONTINUED] verapamil (CALAN-SR) 180 MG CR tablet Take 180 mg by mouth daily.  06/12/2014: (for exercise-induced arrhythmia, from cardiologist)  . acetaminophen (TYLENOL) 500 MG tablet Take 500 mg by mouth every 6 (six) hours as needed for mild pain. Reported on 07/05/2015   . cetirizine (ZYRTEC) 10 MG tablet Take 10 mg by mouth daily. Reported on 07/05/2015   . nitroGLYCERIN (NITROSTAT) 0.4 MG SL tablet Place 1 tablet (0.4 mg total) under the tongue every 5 (five) minutes as needed. For chest pain (Patient not taking: Reported on 07/05/2015)   . [DISCONTINUED] buPROPion (WELLBUTRIN XL) 150 MG 24 hr tablet TAKE 1 TABLET DAILY   . [DISCONTINUED] metaxalone (SKELAXIN) 800 MG tablet Take 1 tablet (800 mg total) by mouth 2 (two) times daily as needed for muscle spasms.    No facility-administered encounter medications on file as of 07/05/2015.   Allergies  Allergen Reactions  . Anectine [Succinylcholine Chloride]     "FAMILY ALLERGY"  . Shellfish Allergy Other (See Comments)    Unknown...dr as child said to never eat   Immunization History  Administered Date(s) Administered  . Pneumococcal Conjugate-13 06/28/2014  .  Tdap 06/28/2014    ROS:  No fever, chills, headaches, dizziness, chest pain, nausea, vomiting, bowel changes. No thyroid symptoms other than some heat intolerance, related to her heart. +Left knee pain--she drives long distances, rests the left leg with hip externally rotated. Sometimes has discomfort and sharp pains with certain positions.  No urinary complaints, bleeding, bruising, rash, GU complaints, breast/chest concerns, or other complaints.  See  HPI  PHYSICAL EXAM: BP 120/72 mmHg  Pulse 68  Ht 5' 4.5" (1.638 m)  Wt 211 lb (95.709 kg)  BMI 35.67 kg/m2  Well developed, talkative, pleasant female in no distress HEENT: PERRL, EOMI, conjunctiva and sclera are clear Neck: no lymphadenopathy, thyromegaly or carotid bruit Heart: regular rate and rhythm Lungs: clear bilaterally Back: mild diffuse tenderness over back, mainly centrally. No muscle spasm or CVA tenderness Abdomen: soft, nontender, no organomegaly or mass Extremities: no edema, normal pulses. Left knee without effusion/warmth Skin: tanned, no suspicious lesions, rashes, bruising, normal turgor Psych: normal mood, affect, hygiene and grooming Neuro: alert and oriented, normal strength, gait  ASSESSMENT/PLAN:  Insomnia - c/b 1mg  xanax every night.  discussed risks and alternatives (ie Belsomra), given side effects to other meds tried. Prefers to continue xanax, since effective  History of breast cancer - due for breast/pelvic exam--advised to schedule  Fibromyalgia - tolerable; chronic pain daily  Anxiety and depression - much improved, tapering off meds  Exercise induced LBBB - continues to limit her ability to exercise, due to dyspnea, worse in the heat  Left knee pain - keep in neutral position while driving; short course of NSAIDs, risks reviewed    Risks of alprazolam reviewed, not recommended for longterm use for insomnia. She declines trial of Belsomra, since xanax has been very effective, without side effects.  Change recent rx to #90, (didn't pick up the #30 yet at pharmacy).  Please check with your insurance about zostavax coverage (shingles vaccine). If covered and desired, you can schedule a nurse visit, or get next time you come. It needs to be 1 month separate from any other vaccine (or given same day--ie given same day as a flu shot).  Consider Belsomra for sleep in the future (this is the medication we discussed).  In one month (after being  off the wellbutrin), if doing well mood-wise, try cutting the citalopram to 1/2 pill every day.  Do this for at least 1 month. If you notice any recurrent anxiety or depression, go back up to the full tablet.  If doing well, you can further taper to either 1/2 tablet every other day, vs 1/4 tablet, vs calling for 10mg  dose.  Stay at the 10mg  for a month, then if doing well still, every other day for a week and then stop it entirely.   Try taking ibuprofen (600-800mg ) or 2 aleve prior to your long drives. If knee pain is flaring, you can take these regularly for up to 7-10 days to see if that helps resolve the problems.  2 aleve twice daily with food, or 600-800mg  if ibuprofen three times daily with food.  Don't take both--one type or the other. You can also take tylenol along with this, if needed.  >30 min visit, more than 1/2 spent counseling.

## 2015-07-18 ENCOUNTER — Encounter: Payer: Self-pay | Admitting: Family Medicine

## 2015-07-20 ENCOUNTER — Encounter: Payer: Self-pay | Admitting: Genetic Counselor

## 2015-09-12 ENCOUNTER — Telehealth: Payer: Self-pay | Admitting: Family Medicine

## 2015-09-12 NOTE — Telephone Encounter (Signed)
These forms are very specific for cardiac clearance.  She has a cardiologist--recommend Dr. Tamala Julian fill out these forms (not sure if they were also sent to him or not)--forms are in red folder

## 2015-09-13 ENCOUNTER — Other Ambulatory Visit: Payer: Self-pay | Admitting: Family Medicine

## 2015-09-13 NOTE — Telephone Encounter (Signed)
Patient advised. Dr.Tilley is her cardiologist and she has been in contact with him.

## 2015-09-13 NOTE — Telephone Encounter (Signed)
Patient called and said that [facility] filled out the form and faxed back. Ortho office wants both cardiologist and PCP to clear patient. Bryelle states that [facility] filled out the cardiac part and all you need to do is state that she is in good heath and okay for surgery. Wants to know if you will do this, would mean a lot to her. They had a cancellation and if form could be faxed back today she can have that appt. (I put the form on your shelf, in case) Thanks.

## 2015-09-13 NOTE — Telephone Encounter (Signed)
I filled out as she requested, stating that cardiac clearance was coming separately.

## 2015-09-13 NOTE — Telephone Encounter (Signed)
Pt states that she needs surgery clearance form filled out ASAP. She states that she needs surgery on Oct 15th  and in order to move her appt to this day she needs clearance form faxed to [phone]. Please call pt at [phone]

## 2015-09-26 ENCOUNTER — Other Ambulatory Visit: Payer: Self-pay | Admitting: Family Medicine

## 2015-09-26 NOTE — Telephone Encounter (Signed)
Is this okay to call in? 

## 2015-09-26 NOTE — Telephone Encounter (Signed)
Ok to refill #90, no refill

## 2015-12-03 ENCOUNTER — Telehealth: Payer: Self-pay | Admitting: Family Medicine

## 2015-12-03 MED ORDER — ALPRAZOLAM 1 MG PO TABS: ORAL_TABLET | ORAL | 0 refills | Status: DC

## 2015-12-03 NOTE — Telephone Encounter (Signed)
Phoned in.

## 2015-12-03 NOTE — Telephone Encounter (Signed)
Last filled 8/30.  Okay to refill #90

## 2015-12-03 NOTE — Telephone Encounter (Signed)
Pt request refill on Xanax 1MG . She said she know it is sooner than the normal 90 days but she had to use this med more due to her recent knee replacement surgery. Send refill to [facility], [place]

## 2016-01-15 NOTE — Progress Notes (Signed)
Chief Complaint  Patient presents with  . Annual Exam    fasting annual exam with pelvic. Has eye exam scheduled in [place] after Christmas. No concerns.     Allison Valencia is a 60 y.o. female who presents for a complete physical.  She has the following concerns:  She had L total knee replacement in [place] in October. She has continued pain in the left knee, especially with a lot of throbbing after 5 minutes of standing/activity or with 5 minutes of inactivity, relieved by moving it around. When in [place] she is going to PT 3x/week, and has home exercises for when she is home in [place]. She is living about half time in [place]. She plans to establish with a PCP there, since she is there so often, in case she becomes sick.  She plans to continue to follow here as well.  Fibromyalgia--"I just deal with it".  Muscle relaxers don't help, so she doesn't take them.  She doesn't take anything for pain. NSAIDs weren't effective for FM pain.  She has previously seen Dr. Estanislado Pandy. Hot baths helps some. She was started on Lyrica in 05/2014 when she had a significant fibromyalgia flare.  She was also given depo-medrol injection at that time.  She did have some improvement within a day of last visit. She recalls that Lyrica made her sleepy during the day, requiring a nap x 1 hr.  Her level of pain was reported to be much improved while taking it (per records/notes here).  She states that she didn't like being sleepy and now states it didn't work, so never continued it for a long period of time. It was also very expensive, and reports she never filled rx, and still even has some samples left over.  She finds that using 2-3 puffs of marijuana every night significantly helps her pain.  Breast Cancer--she is s/p extended partial mastectomy on the right (age 54), followed by bilateral mastectomy (with LN excision on the left, right already removed). No longer gets mammograms, or sees anybody. She  states she only saw oncologist once, stating that chemo wasn't recommended due to her receptors.  Anxiety/depression and insomnia: She tapered off the wellbutrin in June when running low (but refilled in August by mistake from mail order--she didn't resume taking it). And we discussed tapering down/off citalopram at that time (per her request), but thinks she still needs this. She is still taking 32m daily. Denies side effects. Moods are well controlled.  She uses the alprazolam nightly for sleep,  And prn use for anxiety, which usually is very rare--used a little more related to her recent surgery. She no longer needs it during the day. She had nightmares from other medications, and other medications she tried were ineffective. She has gone a week without alprazolam (when she forgot to take bottle when she was out of town), and used Simply Sleep instead. This wasn't as effective as it had been in the past (at one time that was helpful). Problems with insomnia only started after her hysterectomy.  We previously discussed Belsomra, wasn't interested in trying.  May be willing to at least try it now.  Exercise-induced LBBB: Doing well on Calan without side effects. She is under the care of Dr. TWynonia Lawmanand also saw him this week (no records yet received).  DOE persists, does better when it is cool out. Did a little better this summer, on the higher dose of verapamil. She doesn't check blood pressure elsewhere.  Her  BP was noted to be high at visit with Dr. Wynonia Lawman, and she ordered an [employer] BP cuff (to [facility]).   She has sleep apnea.  She refuses to try CPAP (her husband uses). Denies daytime somnolence.  Immunization History  Administered Date(s) Administered  . Pneumococcal Conjugate-13 06/28/2014  . Tdap 06/28/2014  had flu shot 11/15/15 zostavax was discussed at her 06/2014 CPE Last Pap smear: had hysterectomy Last mammogram: 10/2008 (prior to her mastectomy) Last colonoscopy:  10/2014 with Dr. Amedeo Plenty, normal.  Repeat in 5 years (10/2019--+h/o polyps and +Fhx) Last DEXA: 10/2014 at [facility], NORMAL Dentist: 5-6 years ago ("petrified"), used to do sedation dentistry but too expensive.  Has broken teeth.  Looking for sedation dentist who takes her insurance (that she just got). Ophtho: 01/2014, scheduled soon.          Exercise: limited by fibromyalgia pain (and palpitations--exercise-induced BBB), and recent knee surgery. When in [place], getting PT for knee 3x/wk. Walking long distances at the hospital in [place] to visit a sick friend's mother.  Past Medical History:  Diagnosis Date  . Allergy   . Anxiety   . Asthma   . Breast cancer [facility]) age 33, recurrent 2010  . Breast cancer ([place])   . Depression   . Fibromyalgia 2/07  . Hypertension 2006  . OSA (obstructive sleep apnea)    sleep study 6/09 (not on treatment; pt refuses CPAP)  . Tricuspid regurgitation 6/02   mild  . Viral meningitis 5/99    Past Surgical History:  Procedure Laterality Date  . ABDOMINAL HYSTERECTOMY  03/2009   and BSO; Dr. Raphael Gibney  . BREAST SURGERY     extended partial R mastectomy with LND age 70; bilateral mastectomy 11/2008, multiple reconstructive surgeries 2011-2012  . CARDIAC CATHETERIZATION N/A 01/17/2015   Procedure: Left Heart Cath and Coronary Angiography;  Surgeon: Belva Crome, MD;  Location: [facility] CV LAB;  Service: Cardiovascular;  Laterality: N/A;  . LEFT HEART CATHETERIZATION WITH CORONARY ANGIOGRAM N/A 07/24/2011   Procedure: LEFT HEART CATHETERIZATION WITH CORONARY ANGIOGRAM;  Surgeon: Jacolyn Reedy, MD;  Location: [facility] CATH LAB;  Service: Cardiovascular;  Laterality: N/A;  . MASTECTOMY    . VESICOVAGINAL FISTULA CLOSURE W/ TAH      Social History   Social History  . Marital status: Married    Spouse name: N/A  . Number of children: N/A  . Years of education: N/A   Occupational History  . Not on file.   Social History Main Topics  . Smoking  status: Never Smoker  . Smokeless tobacco: Never Used  . Alcohol use Yes     Comment: 1-2 drinks once or twice a month.  . Drug use:     Types: Marijuana     Comment: 2-3 puffs/night--helps with her fibromyalgia pain  . Sexual activity: Not on file   Other Topics Concern  . Not on file   Social History Narrative   Lives with husband (who is mostly now in [place], only home 30d/year); She also has a home in [place], where she spends much of her time--daughter and 2 grandchildren live there.   Husband is an alcoholic, in [place]. He does drink some in [place], but no alcohol when in [place].   Tiffany moved out, is married and lives in [place]. She has bipolar disorder    Family History  Problem Relation Age of Onset  . Asthma Mother   . Hypertension Mother   . Thyroid disease  Mother   . Arthritis Mother   . Dementia Mother   . Heart disease Father     MI  . Diabetes Father   . Bipolar disorder Daughter   . Healthy Daughter   . Cancer Maternal Aunt     colon  . Colon cancer Maternal Aunt     >60  . Cancer Paternal Aunt     breast cancer  . Breast cancer Paternal Aunt 75  . Cancer Maternal Aunt     colon  . Colon cancer Maternal Aunt     >60    Outpatient Encounter Prescriptions as of 01/16/2016  Medication Sig Note  . ALPRAZolam (XANAX) 1 MG tablet take 1 tablet by mouth three times a day if needed for anxiety or sleep 01/16/2016: Takes every night  . aspirin EC 81 MG tablet Take 81 mg by mouth daily.   . citalopram (CELEXA) 40 MG tablet Take 40 mg by mouth daily.   . verapamil (CALAN-SR) 240 MG CR tablet Take 240 mg by mouth daily.  07/05/2015: Received from: External Pharmacy  . cetirizine (ZYRTEC) 10 MG tablet Take 10 mg by mouth daily. Reported on 07/05/2015   . meloxicam (MOBIC) 15 MG tablet Take 15 mg by mouth daily.  01/16/2016: Didn't take it today, but has been using daily for her knee  . nitroGLYCERIN (NITROSTAT) 0.4 MG SL tablet Place 1 tablet (0.4 mg  total) under the tongue every 5 (five) minutes as needed. For chest pain (Patient not taking: Reported on 01/16/2016)   . traMADol (ULTRAM) 50 MG tablet Take 50 mg by mouth every 6 (six) hours as needed. 01/16/2016: Uses prn (a bottle can last a month); doesn't help very much  . [DISCONTINUED] acetaminophen (TYLENOL) 500 MG tablet Take 500 mg by mouth every 6 (six) hours as needed for mild pain. Reported on 07/05/2015   . [DISCONTINUED] buPROPion (WELLBUTRIN XL) 150 MG 24 hr tablet TAKE 1 TABLET DAILY (Patient not taking: Reported on 01/16/2016)    No facility-administered encounter medications on file as of 01/16/2016.     Allergies  Allergen Reactions  . Anectine [Succinylcholine Chloride]     "FAMILY ALLERGY"  . Shellfish Allergy Other (See Comments)    Unknown...dr as child said to never eat    ROS:  The patient denies anorexia, fever, headaches, vision changes, decreased hearing, ear pain, sore throat, breast concerns, chest pain, palpitations (just with exercise), dizziness, syncope, dyspnea, cough, swelling, nausea, vomiting, diarrhea, constipation, abdominal pain, melena, hematochezia, indigestion/heartburn, hematuria, incontinence, dysuria, vaginal bleeding, discharge, odor or itch, genital lesions, joint pains, numbness, tingling, weakness, tremor, suspicious skin lesions, abnormal bleeding/bruising (unchanged; on aspirin), or enlarged lymph nodes. Feels like her urine stream is weaker, feels like she empties well. She isn't drinking as much water as she normally does. Moods are controlled with her current medications. Weight unchanged from June--had lost some weight prior to knee surgery which she regained. Asthma only flares with URI's or with dry leaves.  Hasn't needed an inhaler in many years. +myalgias/fibromyalgia +insomnia as per HPI   PHYSICAL EXAM:  BP (!) 158/98 (BP Location: Left Arm, Patient Position: Sitting, Cuff Size: Normal)   Pulse 72   Ht 5' 4" (1.626 m)   Wt  210 lb 12.8 oz (95.6 kg)   BMI 36.18 kg/m   162/84 on repeat by MD  General Appearance:    Alert, cooperative, no distress, appears stated age  Head:    Normocephalic, without obvious abnormality, atraumatic  Eyes:  PERRL, conjunctiva/corneas clear, EOM's intact, fundi    benign  Ears:    Normal TM's and external ear canals  Nose:   Nares normal, mucosa normal, no drainage; no sinus tenderness  Throat:   Lips, mucosa, and tongue normal; teeth and gums normal  Neck:   Supple, no lymphadenopathy;  thyroid:  no   enlargement/tenderness/nodules; no carotid bruit or JVD  Back:    No CVA tenderness; mildly tender over mid-lumbar spine, and many trigger points in lower paraspinous muscles. No trigger points in upper back  Lungs:     Clear to auscultation bilaterally without wheezes, rales or     ronchi; respirations unlabored  Chest Wall:    No tenderness or deformity   Heart:    Regular rate and rhythm, S1 and S2 normal, no murmur, rub   or gallop  Breast Exam:    No tenderness. She is s/p bilateral mastectomy with reconstruction.  There are bilateral implants, and tattos. No masses or abnormalities are noted. No axillary lymphadenopathy  Abdomen:     Soft, non-tender, nondistended, normoactive bowel sounds,    no masses, no hepatosplenomegaly. WHSS  Genitalia:    Normal external genitalia without lesions.  BUS and vagina normal;  Uterus is surgically absent.  No adnexal masses or tenderness. Pap not performed  Rectal:    Normal tone, no masses or tenderness; guaiac negative stool  Extremities:   No clubbing, cyanosis or edema. WHSS left knee. Slightly warmer, no swelling  Pulses:   2+ and symmetric all extremities  Skin:   Skin color, texture, turgor normal, no rashes or lesions  Lymph nodes:   Cervical, supraclavicular, and axillary nodes normal  Neurologic:   CNII-XII intact, normal strength, sensation and gait; reflexes 2+ and symmetric throughout                                 Psych:   Normal mood, affect, hygiene and grooming.    ASSESSMENT/PLAN:  Annual physical exam - Plan: POCT Urinalysis Dipstick, Lipid panel, CBC with Differential/Platelet, TSH, Comprehensive metabolic panel  Anxiety and depression - controlled  Insomnia, unspecified type - encouraged trial of Belsomra rather than chronic/daily alprazolam use for sleep. card given, and printed rx's x 3 doses and how to use - Plan: Suvorexant (BELSOMRA) 10 MG TABS, Suvorexant (BELSOMRA) 15 MG TABS, Suvorexant (BELSOMRA) 20 MG TABS  Fibromyalgia - improved with daily marijuana use (not interested in other med trials); stable overall  History of breast cancer  Medication monitoring encounter - Plan: CBC with Differential/Platelet, Comprehensive metabolic panel  Depression, major, in remission ([place]) - Plan: citalopram (CELEXA) 40 MG tablet  OSA (obstructive sleep apnea) - encouraged treatment--find dentist who uses oral appliances (refuses CPAP). Risks of untreated OSA reviewed  Obesity (BMI 30-39.9) - counseled re: diet, exercise, weight loss  Exercise induced LBBB  Elevated blood pressure reading - x2 this week; reviewed low sodium diet, monitoring at home, regular exercise, weight loss; if persistently elevated, needs meds adjusted.     c-met, CBC, lipid, TSH  Discussed monthly self breast exams and yearly mammograms--she has had bilateral mastectomy and likely no longer needs.  Recommended at least 30 minutes of aerobic activity at least 5 days/week, weight-bearing exercise at least 2x/wk; proper sunscreen use reviewed; healthy diet, including goals of calcium and vitamin D intake and alcohol recommendations (less than or equal to 1 drink/day) reviewed; regular seatbelt use; changing batteries in  smoke detectors.  Immunization recommendations discussed--continue yearly flu shots. Shingrix recommended when available.  Colonoscopy recommendations reviewed--UTD. Due again in 2021.   Shingrix recommended when  available. Trial of Belsomra  Risks of alprazolam reviewed, not recommended for longterm use for insomnia.   It is important to try and treat sleep apnea, as this may be affecting your heart and blood pressure. I know you aren't interested in trying CPAP--consider discussing with a dentist that makes the oral appliances.  (and that way you can also see a dentist!)  Limit the sodium in your diet. Keep track of your blood pressures and either fax them to me or bring to your visit with the new doctor in [place]. Bring your cuff with you so they can check that it is accurate.   Getting new PCP in [place] since there 1/2 time

## 2016-01-16 ENCOUNTER — Ambulatory Visit (INDEPENDENT_AMBULATORY_CARE_PROVIDER_SITE_OTHER): Payer: BLUE CROSS/BLUE SHIELD | Admitting: Family Medicine

## 2016-01-16 ENCOUNTER — Encounter: Payer: Self-pay | Admitting: Family Medicine

## 2016-01-16 VITALS — BP 162/84 | HR 72 | Ht 64.0 in | Wt 210.8 lb

## 2016-01-16 DIAGNOSIS — R030 Elevated blood-pressure reading, without diagnosis of hypertension: Secondary | ICD-10-CM

## 2016-01-16 DIAGNOSIS — I447 Left bundle-branch block, unspecified: Secondary | ICD-10-CM | POA: Diagnosis not present

## 2016-01-16 DIAGNOSIS — E669 Obesity, unspecified: Secondary | ICD-10-CM | POA: Diagnosis not present

## 2016-01-16 DIAGNOSIS — F418 Other specified anxiety disorders: Secondary | ICD-10-CM | POA: Diagnosis not present

## 2016-01-16 DIAGNOSIS — M797 Fibromyalgia: Secondary | ICD-10-CM | POA: Diagnosis not present

## 2016-01-16 DIAGNOSIS — G4733 Obstructive sleep apnea (adult) (pediatric): Secondary | ICD-10-CM | POA: Diagnosis not present

## 2016-01-16 DIAGNOSIS — G4700 Insomnia, unspecified: Secondary | ICD-10-CM | POA: Diagnosis not present

## 2016-01-16 DIAGNOSIS — F32A Depression, unspecified: Secondary | ICD-10-CM

## 2016-01-16 DIAGNOSIS — F325 Major depressive disorder, single episode, in full remission: Secondary | ICD-10-CM

## 2016-01-16 DIAGNOSIS — Z853 Personal history of malignant neoplasm of breast: Secondary | ICD-10-CM

## 2016-01-16 DIAGNOSIS — Z5181 Encounter for therapeutic drug level monitoring: Secondary | ICD-10-CM

## 2016-01-16 DIAGNOSIS — F419 Anxiety disorder, unspecified: Secondary | ICD-10-CM

## 2016-01-16 DIAGNOSIS — Z0000 Encounter for general adult medical examination without abnormal findings: Secondary | ICD-10-CM | POA: Diagnosis not present

## 2016-01-16 DIAGNOSIS — F329 Major depressive disorder, single episode, unspecified: Secondary | ICD-10-CM

## 2016-01-16 LAB — COMPREHENSIVE METABOLIC PANEL
ALK PHOS: 64 U/L (ref 33–130)
ALT: 12 U/L (ref 6–29)
AST: 19 U/L (ref 10–35)
Albumin: 4.3 g/dL (ref 3.6–5.1)
BUN: 23 mg/dL (ref 7–25)
CO2: 22 mmol/L (ref 20–31)
Calcium: 9.6 mg/dL (ref 8.6–10.4)
Chloride: 101 mmol/L (ref 98–110)
Creat: 0.96 mg/dL (ref 0.50–0.99)
GLUCOSE: 76 mg/dL (ref 65–99)
POTASSIUM: 4.1 mmol/L (ref 3.5–5.3)
Sodium: 137 mmol/L (ref 135–146)
Total Bilirubin: 0.4 mg/dL (ref 0.2–1.2)
Total Protein: 7.4 g/dL (ref 6.1–8.1)

## 2016-01-16 LAB — POCT URINALYSIS DIPSTICK
BILIRUBIN UA: NEGATIVE
GLUCOSE UA: NEGATIVE
Nitrite, UA: NEGATIVE
Protein, UA: NEGATIVE
RBC UA: NEGATIVE
Urobilinogen, UA: NEGATIVE
pH, UA: 6

## 2016-01-16 LAB — CBC WITH DIFFERENTIAL/PLATELET
BASOS ABS: 0 {cells}/uL (ref 0–200)
BASOS PCT: 0 %
EOS PCT: 2 %
Eosinophils Absolute: 152 cells/uL (ref 15–500)
HCT: 41.9 % (ref 35.0–45.0)
Hemoglobin: 13.6 g/dL (ref 11.7–15.5)
Lymphocytes Relative: 31 %
Lymphs Abs: 2356 cells/uL (ref 850–3900)
MCH: 28.2 pg (ref 27.0–33.0)
MCHC: 32.5 g/dL (ref 32.0–36.0)
MCV: 86.7 fL (ref 80.0–100.0)
MONOS PCT: 6 %
MPV: 9.7 fL (ref 7.5–12.5)
Monocytes Absolute: 456 cells/uL (ref 200–950)
NEUTROS ABS: 4636 {cells}/uL (ref 1500–7800)
Neutrophils Relative %: 61 %
PLATELETS: 257 10*3/uL (ref 140–400)
RBC: 4.83 MIL/uL (ref 3.80–5.10)
RDW: 13.7 % (ref 11.0–15.0)
WBC: 7.6 10*3/uL (ref 4.0–10.5)

## 2016-01-16 LAB — LIPID PANEL
Cholesterol: 180 mg/dL (ref <200)
HDL: 51 mg/dL (ref >50)
LDL CALC: 112 mg/dL — AB (ref <100)
Total CHOL/HDL Ratio: 3.5 Ratio (ref <5.0)
Triglycerides: 83 mg/dL (ref <150)
VLDL: 17 mg/dL (ref <30)

## 2016-01-16 MED ORDER — SUVOREXANT 10 MG PO TABS: 1.0000 | ORAL_TABLET | Freq: Every day | ORAL | 0 refills | Status: AC

## 2016-01-16 MED ORDER — SUVOREXANT 20 MG PO TABS: 1.0000 | ORAL_TABLET | Freq: Every day | ORAL | 0 refills | Status: AC

## 2016-01-16 MED ORDER — SUVOREXANT 15 MG PO TABS: 1.0000 | ORAL_TABLET | Freq: Every day | ORAL | 0 refills | Status: AC

## 2016-01-16 MED ORDER — CITALOPRAM HYDROBROMIDE 40 MG PO TABS: 40.0000 mg | ORAL_TABLET | Freq: Every day | ORAL | 3 refills | Status: AC

## 2016-01-16 NOTE — Patient Instructions (Addendum)
  HEALTH MAINTENANCE RECOMMENDATIONS:  It is recommended that you get at least 30 minutes of aerobic exercise at least 5 days/week (for weight loss, you may need as much as 60-90 minutes). This can be any activity that gets your heart rate up. This can be divided in 10-15 minute intervals if needed, but try and build up your endurance at least once a week.  Weight bearing exercise is also recommended twice weekly.  Eat a healthy diet with lots of vegetables, fruits and fiber.  "Colorful" foods have a lot of vitamins (ie green vegetables, tomatoes, red peppers, etc).  Limit sweet tea, regular sodas and alcoholic beverages, all of which has a lot of calories and sugar.  Up to 1 alcoholic drink daily may be beneficial for women (unless trying to lose weight, watch sugars).  Drink a lot of water.  Calcium recommendations are 1200-1500 mg daily (1500 mg for postmenopausal women or women without ovaries), and vitamin D 1000 IU daily.  This should be obtained from diet and/or supplements (vitamins), and calcium should not be taken all at once, but in divided doses.  Monthly self breast exams and yearly mammograms for women over the age of 47 is recommended.  Sunscreen of at least SPF 30 should be used on all sun-exposed parts of the skin when outside between the hours of 10 am and 4 pm (not just when at beach or pool, but even with exercise, golf, tennis, and yard work!)  Use a sunscreen that says "broad spectrum" so it covers both UVA and UVB rays, and make sure to reapply every 1-2 hours.  Remember to change the batteries in your smoke detectors when changing your clock times in the spring and fall.  Use your seat belt every time you are in a car, and please drive safely and not be distracted with cell phones and texting while driving.  It is important to try and treat sleep apnea, as this may be affecting your heart and blood pressure. I know you aren't interested in trying CPAP--consider discussing with  a dentist that makes the oral appliances.  (and that way you can also see a dentist!)  I recommend getting the new shingles vaccine (Shingrix) when available. You will need to check with your insurance to see if it is covered.  It is a series of 2 injections, spaced 2 months apart.  Try the Belsomra. We will give you a card that gives you 10 free pills at each dose. You don't have to wait a full 10 days before increasing if you aren't getting any effect. It works differently than other sleeping pills, be a little patient. It is safe to use longterm, regardless of age   Limit the sodium in your diet. Keep track of your blood pressures and either fax them to me or bring to your visit with the new doctor in [place]. Bring your cuff with you so they can check that it is accurate. Also be sure to record your pulse with the blood pressure readings.

## 2016-01-17 LAB — TSH: TSH: 1.99 m[IU]/L

## 2016-01-29 ENCOUNTER — Other Ambulatory Visit: Payer: Self-pay | Admitting: Family Medicine

## 2016-01-29 DIAGNOSIS — F325 Major depressive disorder, single episode, in full remission: Secondary | ICD-10-CM

## 2016-03-03 ENCOUNTER — Telehealth: Payer: Self-pay | Admitting: Family Medicine

## 2016-03-03 MED ORDER — ALPRAZOLAM 1 MG PO TABS: ORAL_TABLET | ORAL | 0 refills | Status: DC

## 2016-03-03 NOTE — Telephone Encounter (Signed)
[facility] for #90, no refill

## 2016-03-03 NOTE — Telephone Encounter (Addendum)
Requesting refill on Xanax 1 MG to [facility] at [facility] @ [address]

## 2016-05-15 ENCOUNTER — Encounter: Payer: Self-pay | Admitting: Family Medicine

## 2016-07-24 ENCOUNTER — Other Ambulatory Visit: Payer: Self-pay | Admitting: Family Medicine

## 2016-07-24 NOTE — Telephone Encounter (Signed)
Is this okay to call in? 

## 2016-07-24 NOTE — Telephone Encounter (Signed)
Ok to refill.  Has appt Jan

## 2016-12-22 ENCOUNTER — Other Ambulatory Visit: Payer: Self-pay | Admitting: Family Medicine

## 2016-12-22 NOTE — Telephone Encounter (Signed)
Ok to refill.  Has appt 02/02/17

## 2016-12-22 NOTE — Telephone Encounter (Signed)
Is this okay to refill? 

## 2017-02-02 ENCOUNTER — Ambulatory Visit: Payer: BLUE CROSS/BLUE SHIELD | Admitting: Family Medicine

## 2018-05-12 ENCOUNTER — Telehealth: Payer: Self-pay | Admitting: Family Medicine

## 2018-05-12 NOTE — Telephone Encounter (Signed)
Husband called wanting to know if we had any thermometers to give out.  I explained no.  He said he needed to know if his wife had a fever.  I asked him were there any other symptoms and he said she was so shakey that she could not stand up.  I explained it sounded like she needed care and he said she had a dr in [place] and he hung up.

## 2018-05-12 NOTE — Telephone Encounter (Signed)
Got message from patient that she was able to borrow a neighbor's thermometer, and to disregard the message. She is currently in [place], but mainly lives these days in their other house in [place], where she has established with PCP.  She hasn't been seen in this office in a few years.

## 2018-10-07 ENCOUNTER — Other Ambulatory Visit: Payer: Self-pay | Admitting: Cardiology

## 2018-10-07 MED ORDER — VERAPAMIL HCL ER 240 MG PO TBCR: 240.0000 mg | EXTENDED_RELEASE_TABLET | Freq: Every day | ORAL | 0 refills | Status: DC

## 2018-10-07 NOTE — Telephone Encounter (Signed)
°*  STAT* If patient is at the pharmacy, call can be transferred to refill team.   1. Which medications need to be refilled? (please list name of each medication and dose if known) Verapamil 240mg  tablet  2. Which pharmacy/location (including street and city if local pharmacy) is medication to be sent to?[facility] [address] [place]  3. Do they need a 30 day or 90 day supply? [place]

## 2018-10-07 NOTE — Telephone Encounter (Signed)
Rx for verapamil 240mg  once daily sent to [facility] as requested by patient.  Patient was contacted to schedule follow up appointment with our office for future refills, but patient states she lives between [place] and she needs to check her schedule. Patient will contact our office when she gets an opportunity to check her calendar to make an appointment.  Patient agreed to plan and verbalized understanding. No further questions.

## 2019-01-09 ENCOUNTER — Other Ambulatory Visit: Payer: Self-pay | Admitting: Cardiology

## 2019-01-12 NOTE — Telephone Encounter (Signed)
Office visit required for further refills.  Previous patient of Dr. Wynonia Lawman who has been lost to follow up.

## 2019-03-18 ENCOUNTER — Other Ambulatory Visit: Payer: Self-pay | Admitting: Family

## 2019-04-07 ENCOUNTER — Other Ambulatory Visit: Payer: Self-pay | Admitting: Cardiology

## 2019-04-26 ENCOUNTER — Other Ambulatory Visit: Payer: Self-pay | Admitting: Cardiology

## 2019-09-07 ENCOUNTER — Encounter: Payer: Self-pay | Admitting: Genetic Counselor
